# Patient Record
Sex: Female | Born: 1970
Health system: Southern US, Community
[De-identification: ages and names within clinical notes are randomized; demographics above are authoritative.]

## PROBLEM LIST (undated history)

## (undated) DIAGNOSIS — C801 Malignant (primary) neoplasm, unspecified: Secondary | ICD-10-CM

## (undated) DIAGNOSIS — D241 Benign neoplasm of right breast: Secondary | ICD-10-CM

## (undated) DIAGNOSIS — D509 Iron deficiency anemia, unspecified: Secondary | ICD-10-CM

## (undated) DIAGNOSIS — R112 Nausea with vomiting, unspecified: Secondary | ICD-10-CM

## (undated) DIAGNOSIS — Z9889 Other specified postprocedural states: Secondary | ICD-10-CM

## (undated) HISTORY — PX: BREAST SURGERY: SHX581

## (undated) HISTORY — PX: OTHER SURGICAL HISTORY: SHX169

## (undated) HISTORY — PX: HERNIA REPAIR: SHX51

## (undated) HISTORY — DX: Iron deficiency anemia, unspecified: D50.9

---

## 1998-01-19 ENCOUNTER — Other Ambulatory Visit: Admission: RE | Admit: 1998-01-19 | Discharge: 1998-01-19 | Payer: Self-pay | Admitting: Obstetrics and Gynecology

## 1998-02-21 ENCOUNTER — Inpatient Hospital Stay (HOSPITAL_COMMUNITY): Admission: AD | Admit: 1998-02-21 | Discharge: 1998-02-23 | Payer: Self-pay | Admitting: Obstetrics and Gynecology

## 1998-04-14 ENCOUNTER — Ambulatory Visit (HOSPITAL_COMMUNITY): Admission: RE | Admit: 1998-04-14 | Discharge: 1998-04-14 | Payer: Self-pay | Admitting: Obstetrics and Gynecology

## 2001-01-09 ENCOUNTER — Other Ambulatory Visit: Admission: RE | Admit: 2001-01-09 | Discharge: 2001-01-09 | Payer: Self-pay | Admitting: Obstetrics and Gynecology

## 2005-11-28 ENCOUNTER — Emergency Department (HOSPITAL_COMMUNITY): Admission: EM | Admit: 2005-11-28 | Discharge: 2005-11-28 | Payer: Self-pay | Admitting: Family Medicine

## 2005-11-29 ENCOUNTER — Ambulatory Visit (HOSPITAL_COMMUNITY): Admission: RE | Admit: 2005-11-29 | Discharge: 2005-11-29 | Payer: Self-pay | Admitting: Family Medicine

## 2005-12-13 ENCOUNTER — Encounter: Admission: RE | Admit: 2005-12-13 | Discharge: 2005-12-13 | Payer: Self-pay | Admitting: Surgery

## 2005-12-14 ENCOUNTER — Ambulatory Visit (HOSPITAL_BASED_OUTPATIENT_CLINIC_OR_DEPARTMENT_OTHER): Admission: RE | Admit: 2005-12-14 | Discharge: 2005-12-14 | Payer: Self-pay | Admitting: Surgery

## 2007-01-03 ENCOUNTER — Emergency Department (HOSPITAL_COMMUNITY): Admission: EM | Admit: 2007-01-03 | Discharge: 2007-01-03 | Payer: Self-pay | Admitting: Family Medicine

## 2007-08-25 ENCOUNTER — Emergency Department (HOSPITAL_COMMUNITY): Admission: EM | Admit: 2007-08-25 | Discharge: 2007-08-25 | Payer: Self-pay | Admitting: Emergency Medicine

## 2007-08-29 ENCOUNTER — Encounter: Admission: RE | Admit: 2007-08-29 | Discharge: 2007-08-29 | Payer: Self-pay | Admitting: Surgery

## 2008-06-01 ENCOUNTER — Encounter: Admission: RE | Admit: 2008-06-01 | Discharge: 2008-06-01 | Payer: Self-pay | Admitting: Geriatric Medicine

## 2010-09-01 NOTE — Op Note (Signed)
Sharon Meyer, Sharon Meyer          ACCOUNT NO.:  [id number]   MEDICAL RECORD NO.:  [id number]          PATIENT TYPE:  AMB   LOCATION:  [facility]                         FACILITY:  [facility]   PHYSICIAN:  Thomas A. Cornett, M.D.DATE OF BIRTH:  [birth year]   DATE OF PROCEDURE:  12/14/2005  DATE OF DISCHARGE:                                 OPERATIVE REPORT   PREOPERATIVE DIAGNOSIS:  Incarcerated umbilical hernia.   POSTOPERATIVE DIAGNOSIS:  Mass involving the fascia at umbilicus.   PROCEDURE:  Excision of abdominal wall mass measuring 4.4 cm found be  endometriosis by frozen section.   SURGEON:  Maisie Fus A. Cornett, M.D.   ANESTHESIA:  LMA with 410 mL of 0.25% Sensorcaine local.   ESTIMATED BLOOD LOSS:  20 mL   SPECIMEN:  4 x 4.5 cm mass to pathology found to be endometriosis by frozen  section by the pathologist.   DRAINS:  None.   INDICATIONS FOR PROCEDURE:  The patient is a 40 year old female who  presented to the office earlier this week due to a painful mass at her  umbilicus which came up over the last 3-4 weeks.  Upon examination, this was  felt to be an incarcerated umbilical hernia with preperitoneal fat and she  is brought in today for elective repair of this.  She had a previous tubal  ligation in the past.  Otherwise, she was doing well.  I explained the  procedure to her and she had no questions after that.   DESCRIPTION OF PROCEDURE:  The patient was brought to the operating room and  placed supine.  After induction of LMA anesthesia, the periumbilical region  was prepped and draped in a sterile fashion.  The nodule was located just  below the umbilicus and was hard.  I made an incision over this after  infiltrating with local anesthesia and dissected down.  I attempted to  mobilize the umbilicus but went to the umbilicus in the process.  A small  hole was made in the umbilicus.  Once I did this, I found the fascia and  felt no defect.  There was a mass located just below  the umbilicus and I  dissected circumferentially around this.  I made a small opening in the  fascia and placed my finger into the intra-abdominal cavity and there is no  evidence of any hernia.  This palpated to be a mass measuring roughly 4 cm  involving the midline of the fascia just below the umbilicus.  I used the  cautery to dissect this out and sent it for frozen section.  It was found to  be endometriosis.  I then closed the fascia with interrupted #1 PDS suture  taking large bites of fascia.  The defect closed without difficulty.  I then  used a 2-0 Vicryl to close the dead space created by dissecting around this  area.  The umbilicus was repaired using a 4-0 Monocryl stitch in a  subcuticular fashion.  I then used a 3-0 Vicryl to tack the umbilicus down  to the fascia.  I closed the skin incision  with 4-0 Monocryl.  A cotton  ball was placed in the umbilicus.  Steri-Strips  were applied.  All final counts of sponge, needle and instruments were found  to be correct at the end of the case.  The patient was awakened and taken to  the recovery room in satisfactory condition.      Thomas A. Cornett, M.D.  Electronically Signed     TAC/MEDQ  D:  12/14/2005  T:  12/14/2005  Job:  [id number]   cc:   Maurice March, M.D.  Fax: [phone]

## 2013-04-16 DIAGNOSIS — N6310 Unspecified lump in the right breast, unspecified quadrant: Secondary | ICD-10-CM | POA: Insufficient documentation

## 2014-06-22 ENCOUNTER — Encounter (HOSPITAL_COMMUNITY): Payer: Self-pay | Admitting: *Deleted

## 2014-06-22 ENCOUNTER — Other Ambulatory Visit: Payer: Self-pay | Admitting: Family Medicine

## 2014-06-22 ENCOUNTER — Emergency Department (HOSPITAL_COMMUNITY)
Admission: EM | Admit: 2014-06-22 | Discharge: 2014-06-22 | Disposition: A | Discharge status: Home / Self Care | Payer: BLUE CROSS/BLUE SHIELD | Source: Home / Self Care | Attending: Family Medicine | Admitting: Family Medicine

## 2014-06-22 DIAGNOSIS — D649 Anemia, unspecified: Secondary | ICD-10-CM

## 2014-06-22 DIAGNOSIS — N924 Excessive bleeding in the premenopausal period: Secondary | ICD-10-CM

## 2014-06-22 DIAGNOSIS — R634 Abnormal weight loss: Secondary | ICD-10-CM

## 2014-06-22 DIAGNOSIS — N921 Excessive and frequent menstruation with irregular cycle: Secondary | ICD-10-CM

## 2014-06-22 LAB — POCT URINALYSIS DIP (DEVICE)
Bilirubin Urine: NEGATIVE
GLUCOSE, UA: NEGATIVE mg/dL
HGB URINE DIPSTICK: NEGATIVE
KETONES UR: NEGATIVE mg/dL
Leukocytes, UA: NEGATIVE
Nitrite: NEGATIVE
PROTEIN: NEGATIVE mg/dL
SPECIFIC GRAVITY, URINE: 1.02 (ref 1.005–1.030)
UROBILINOGEN UA: 1 mg/dL (ref 0.0–1.0)
pH: 7 (ref 5.0–8.0)

## 2014-06-22 LAB — POCT I-STAT, CHEM 8
BUN: 4 mg/dL — ABNORMAL LOW (ref 6–23)
CALCIUM ION: 1.27 mmol/L — AB (ref 1.12–1.23)
CHLORIDE: 105 mmol/L (ref 96–112)
Creatinine, Ser: 0.5 mg/dL (ref 0.50–1.10)
GLUCOSE: 90 mg/dL (ref 70–99)
HEMATOCRIT: 26 % — AB (ref 36.0–46.0)
HEMOGLOBIN: 8.8 g/dL — AB (ref 12.0–15.0)
Potassium: 3.7 mmol/L (ref 3.5–5.1)
Sodium: 141 mmol/L (ref 135–145)
TCO2: 20 mmol/L (ref 0–100)

## 2014-06-22 NOTE — Discharge Instructions (Signed)
Go to dr dewey now for further eval and care.

## 2014-06-22 NOTE — ED Provider Notes (Signed)
CSN: [id number]     Arrival date & time 06/22/14  0174 History   First MD Initiated Contact with Patient 06/22/14 [phone]     Chief Complaint  Patient presents with  . Weight Loss   (Consider location/radiation/quality/duration/timing/severity/associated sxs/prior Treatment) Patient is a 44 y.o. female presenting with general illness. The history is provided by the patient.  Illness Location:  Gen unexplained wt loss since nov of 35-40 lbs with approx 10 in 1 week. Severity:  Moderate Progression:  Worsening Chronicity:  New Context:  Has had neg check for hiv and hepatitis, is menopausal , no smoking Associated symptoms: abdominal pain   Associated symptoms: no chest pain, no cough, no diarrhea, no fatigue, no fever, no nausea, no shortness of breath and no vomiting     History reviewed. No pertinent past medical history. Past Surgical History  Procedure Laterality Date  . Hernia repair    . Btl     History reviewed. No pertinent family history. History  Substance Use Topics  . Smoking status: Never Smoker   . Smokeless tobacco: Not on file  . Alcohol Use: No   OB History    No data available     Review of Systems  Constitutional: Negative.  Negative for fever and fatigue.  HENT: Negative.   Respiratory: Negative for cough and shortness of breath.   Cardiovascular: Negative for chest pain.  Gastrointestinal: Positive for abdominal pain. Negative for nausea, vomiting and diarrhea.  Genitourinary: Positive for pelvic pain.    Allergies  Review of patient's allergies indicates no known allergies.  Home Medications   Prior to Admission medications   Not on File   BP 104/54 mmHg  Pulse 90  Temp(Src) 99.3 F (37.4 C) (Oral)  Resp 16  SpO2 100%  LMP 06/08/2014 Physical Exam  Constitutional: She is oriented to person, place, and time. She appears well-developed and well-nourished. No distress.  HENT:  Head: Normocephalic.  Mouth/Throat: Oropharynx is clear and  moist.  Eyes: Conjunctivae are normal. Pupils are equal, round, and reactive to light.  Neck: Normal range of motion. Neck supple.  Cardiovascular: Normal rate, normal heart sounds and intact distal pulses.   Pulmonary/Chest: Effort normal and breath sounds normal.  Abdominal: Normal appearance and bowel sounds are normal. She exhibits mass. There is no hepatosplenomegaly. There is tenderness in the suprapubic area. There is no CVA tenderness.    Musculoskeletal: She exhibits no edema.  Lymphadenopathy:    She has no cervical adenopathy.  Neurological: She is alert and oriented to person, place, and time.  Skin: Skin is warm and dry.    ED Course  Procedures (including critical care time) Labs Review Labs Reviewed - No data to display i-statnl except hgb 8.8 hct 26, u/a neg Imaging Review No results found.   MDM   1. Unexplained weight loss        Billy Fischer, MD 06/22/14 1053

## 2014-06-22 NOTE — ED Notes (Signed)
Pt  Reports    She  Has  Lost     approx 35-40    Pounds         Over  approx  5  Months           She   Is  Awake  And  Alert   No  Pain  Except         For     Some  dizzyness          She denys  Any       Bleeding            She   States    She  Is  Not  Trying  To  Lose  Weight

## 2014-06-23 ENCOUNTER — Ambulatory Visit
Admission: RE | Admit: 2014-06-23 | Discharge: 2014-06-23 | Disposition: A | Discharge status: Home / Self Care | Payer: BLUE CROSS/BLUE SHIELD | Source: Ambulatory Visit | Attending: Family Medicine | Admitting: Family Medicine

## 2014-06-23 DIAGNOSIS — N924 Excessive bleeding in the premenopausal period: Secondary | ICD-10-CM

## 2014-06-23 DIAGNOSIS — D649 Anemia, unspecified: Secondary | ICD-10-CM

## 2014-06-23 DIAGNOSIS — N921 Excessive and frequent menstruation with irregular cycle: Secondary | ICD-10-CM

## 2014-06-25 ENCOUNTER — Telehealth: Payer: Self-pay | Admitting: Oncology

## 2014-06-25 ENCOUNTER — Other Ambulatory Visit: Payer: Self-pay | Admitting: Family Medicine

## 2014-06-25 DIAGNOSIS — N63 Unspecified lump in unspecified breast: Secondary | ICD-10-CM

## 2014-06-25 NOTE — Telephone Encounter (Signed)
Called referring office.  patient was in referring doctor's office for appt.  I gave Irma appt date and time and she was going to relay the information to patient.

## 2014-06-25 NOTE — Telephone Encounter (Signed)
Called patient to schedule new patient appt. left msg.  Called referring office to let them know that we attempted to call patient and lft msg. Also notified the office that we will hold appt until Tuesday, but it is our policy to confirm appointment before placing in system.    Dx: Anemia, weight loss

## 2014-06-30 ENCOUNTER — Other Ambulatory Visit: Payer: Self-pay | Admitting: Family Medicine

## 2014-06-30 ENCOUNTER — Ambulatory Visit
Admission: RE | Admit: 2014-06-30 | Discharge: 2014-06-30 | Disposition: A | Discharge status: Home / Self Care | Payer: BLUE CROSS/BLUE SHIELD | Source: Ambulatory Visit | Attending: Family Medicine | Admitting: Family Medicine

## 2014-06-30 ENCOUNTER — Other Ambulatory Visit: Payer: BLUE CROSS/BLUE SHIELD

## 2014-06-30 DIAGNOSIS — N63 Unspecified lump in unspecified breast: Secondary | ICD-10-CM

## 2014-07-05 ENCOUNTER — Other Ambulatory Visit: Payer: Self-pay | Admitting: Family Medicine

## 2014-07-05 DIAGNOSIS — N63 Unspecified lump in unspecified breast: Secondary | ICD-10-CM

## 2014-07-09 ENCOUNTER — Ambulatory Visit
Admission: RE | Admit: 2014-07-09 | Discharge: 2014-07-09 | Disposition: A | Discharge status: Home / Self Care | Payer: BLUE CROSS/BLUE SHIELD | Source: Ambulatory Visit | Attending: Family Medicine | Admitting: Family Medicine

## 2014-07-09 DIAGNOSIS — N63 Unspecified lump in unspecified breast: Secondary | ICD-10-CM

## 2014-07-19 ENCOUNTER — Other Ambulatory Visit: Payer: Self-pay | Admitting: Oncology

## 2014-07-19 DIAGNOSIS — D500 Iron deficiency anemia secondary to blood loss (chronic): Secondary | ICD-10-CM

## 2014-07-20 ENCOUNTER — Encounter: Payer: Self-pay | Admitting: Oncology

## 2014-07-20 ENCOUNTER — Telehealth: Payer: Self-pay | Admitting: Oncology

## 2014-07-20 ENCOUNTER — Other Ambulatory Visit (HOSPITAL_BASED_OUTPATIENT_CLINIC_OR_DEPARTMENT_OTHER): Payer: BLUE CROSS/BLUE SHIELD

## 2014-07-20 ENCOUNTER — Ambulatory Visit (HOSPITAL_BASED_OUTPATIENT_CLINIC_OR_DEPARTMENT_OTHER): Payer: BLUE CROSS/BLUE SHIELD | Admitting: Oncology

## 2014-07-20 ENCOUNTER — Ambulatory Visit: Payer: BLUE CROSS/BLUE SHIELD

## 2014-07-20 VITALS — BP 105/69 | HR 61 | Temp 98.2°F | Resp 18 | Ht 68.0 in | Wt 150.1 lb

## 2014-07-20 DIAGNOSIS — D500 Iron deficiency anemia secondary to blood loss (chronic): Secondary | ICD-10-CM

## 2014-07-20 DIAGNOSIS — D259 Leiomyoma of uterus, unspecified: Secondary | ICD-10-CM | POA: Diagnosis not present

## 2014-07-20 DIAGNOSIS — N920 Excessive and frequent menstruation with regular cycle: Secondary | ICD-10-CM | POA: Diagnosis not present

## 2014-07-20 DIAGNOSIS — R928 Other abnormal and inconclusive findings on diagnostic imaging of breast: Secondary | ICD-10-CM | POA: Diagnosis not present

## 2014-07-20 LAB — FERRITIN CHCC: FERRITIN: 22 ng/mL (ref 9–269)

## 2014-07-20 LAB — CBC WITH DIFFERENTIAL/PLATELET
BASO%: 1.6 % (ref 0.0–2.0)
Basophils Absolute: 0.1 10*3/uL (ref 0.0–0.1)
EOS%: 1.9 % (ref 0.0–7.0)
Eosinophils Absolute: 0.1 10*3/uL (ref 0.0–0.5)
HEMATOCRIT: 30.2 % — AB (ref 34.8–46.6)
HEMOGLOBIN: 8.9 g/dL — AB (ref 11.6–15.9)
LYMPH#: 1.7 10*3/uL (ref 0.9–3.3)
LYMPH%: 32.5 % (ref 14.0–49.7)
MCH: 20.8 pg — ABNORMAL LOW (ref 25.1–34.0)
MCHC: 29.4 g/dL — ABNORMAL LOW (ref 31.5–36.0)
MCV: 70.7 fL — AB (ref 79.5–101.0)
MONO#: 0.4 10*3/uL (ref 0.1–0.9)
MONO%: 6.8 % (ref 0.0–14.0)
NEUT#: 3 10*3/uL (ref 1.5–6.5)
NEUT%: 57.2 % (ref 38.4–76.8)
Platelets: 626 10*3/uL — ABNORMAL HIGH (ref 145–400)
RBC: 4.27 10*6/uL (ref 3.70–5.45)
RDW: 35.8 % — ABNORMAL HIGH (ref 11.2–14.5)
WBC: 5.2 10*3/uL (ref 3.9–10.3)

## 2014-07-20 LAB — COMPREHENSIVE METABOLIC PANEL (CC13)
ALBUMIN: 3.6 g/dL (ref 3.5–5.0)
ALT: 9 U/L (ref 0–55)
ANION GAP: 9 meq/L (ref 3–11)
AST: 11 U/L (ref 5–34)
Alkaline Phosphatase: 58 U/L (ref 40–150)
BUN: 7.3 mg/dL (ref 7.0–26.0)
CALCIUM: 9.1 mg/dL (ref 8.4–10.4)
CHLORIDE: 108 meq/L (ref 98–109)
CO2: 23 meq/L (ref 22–29)
Creatinine: 0.7 mg/dL (ref 0.6–1.1)
GLUCOSE: 77 mg/dL (ref 70–140)
POTASSIUM: 3.7 meq/L (ref 3.5–5.1)
SODIUM: 140 meq/L (ref 136–145)
TOTAL PROTEIN: 7.7 g/dL (ref 6.4–8.3)
Total Bilirubin: 0.26 mg/dL (ref 0.20–1.20)

## 2014-07-20 LAB — IRON AND TIBC CHCC
%SAT: 10 % — ABNORMAL LOW (ref 21–57)
IRON: 38 ug/dL — AB (ref 41–142)
TIBC: 373 ug/dL (ref 236–444)
UIBC: 334 ug/dL (ref 120–384)

## 2014-07-20 NOTE — Consult Note (Signed)
Reason for Referral: Anemia.   HPI: 44 year old woman currently of [place] where she have been living and working last few years. She is a rather healthy woman with no significant comorbid conditions. She was noted to be profoundly anemic after presenting with symptoms of fatigue, tiredness and menorrhagia. She had a hemoglobin done on March date 2016 and at that time her hemoglobin was 6.1. Her white cell count was normal at 5.6, MCV was 61 and platelets of 441. Her ferritin was low at 3. She was started on iron supplement and have been taking 3 tablets a day of iron sulfate. She tolerated these pills well without any problem with nausea, dyspepsia or constipation. Her fatigue which has been profound has improved and her ice cravings have improved dramatically. She did not report any hematochezia or melena. She did not report any hematuria or epistaxis. She had been told that she has uterine fibroids and have had heavy menses in the last year. She reports she has 7 day menstrual cycles and from date 2 today for a rather heavy.  She does not report any headaches, blurry vision, double vision, syncope or seizures. She does not report any fevers, chills, sweats she did report weight loss but have stabilized now. She did not report any chest pain, palpitation she did report some fatigue and dyspnea. She did not report any leg edema or palpitation. She does not report any cough or wheezing. She does not report any nausea, vomiting, abdominal pain. She did not report any early satiety. She does not report any frequency urgency or hesitancy. Remainder review of systems unremarkable.   No past medical history on file.:  Past Surgical History  Procedure Laterality Date  . Hernia repair    . Btl    :   Current outpatient prescriptions:  .  ferrous sulfate 325 (65 FE) MG tablet, Take 325 mg by mouth 3 (three) times daily., Disp: , Rfl: 2 .  Vitamin D, Ergocalciferol, (DRISDOL) 50000 UNITS CAPS capsule,  Take 50,000 Units by mouth once a week., Disp: , Rfl: 1:  No Known Allergies:  No family history on file.:  History   Social History  . Marital Status: Single    Spouse Name: N/A  . Number of Children: N/A  . Years of Education: N/A   Occupational History  . Not on file.   Social History Main Topics  . Smoking status: Never Smoker   . Smokeless tobacco: Not on file  . Alcohol Use: No  . Drug Use: Not on file  . Sexual Activity: Not on file   Other Topics Concern  . Not on file   Social History Narrative  . No narrative on file  :  Pertinent items are noted in HPI.  Exam: Blood pressure 105/69, pulse 61, temperature 98.2 F (36.8 C), temperature source Oral, resp. rate 18, height 5\' 8"  (1.727 m), weight 150 lb 1.6 oz (68.085 kg), last menstrual period 06/08/2014, SpO2 100 %. General appearance: alert and cooperative Head: Normocephalic, without obvious abnormality Throat: lips, mucosa, and tongue normal; teeth and gums normal Neck: no adenopathy Back: negative Resp: clear to auscultation bilaterally Chest wall: no tenderness Cardio: regular rate and rhythm, S1, S2 normal, no murmur, click, rub or gallop GI: soft, non-tender; bowel sounds normal; no masses,  no organomegaly Extremities: extremities normal, atraumatic, no cyanosis or edema Pulses: 2+ and symmetric Skin: Skin color, texture, turgor normal. No rashes or lesions Lymph nodes: Cervical, supraclavicular, and axillary nodes normal.  Recent Labs  07/20/14 1352  WBC 5.2  HGB 8.9*  HCT 30.2*  PLT 626*     US Transvaginal Non-ob  06/23/2014   CLINICAL DATA:  EXCESSIVE BLEEDING/ANEMIA; ANEMIA,METRORRHAGIA/ABNORMAL MENSES  EXAM: TRANSABDOMINAL AND TRANSVAGINAL ULTRASOUND OF PELVIS  TECHNIQUE: Both transabdominal and transvaginal ultrasound examinations of the pelvis were performed. Transabdominal technique was performed for global imaging of the pelvis including uterus, ovaries, adnexal regions, and  pelvic cul-de-sac. It was necessary to proceed with endovaginal exam following the transabdominal exam to visualize the ovaries and endometrial stripe.  COMPARISON:  None.  FINDINGS: Uterus  Measurements: 11.9 cm x 7.8 cm x 8.7 cm. RIGHT fundal fibroid is present. This is myometrial in location and extends to the sub serosal region. Fibroid measures 38 mm x 28 mm x 28 mm.  Endometrium  Thickness: Normal at 6 mm.  No focal abnormality visualized.  Right ovary  Measurements: 38 mm x 17 mm x 24 mm. Normal appearance/no adnexal mass.  Left ovary  Measurements: 15 mm x 25 mm x 26 mm. 21 mm cyst or follicle. Normal appearance/no adnexal mass.  Other findings  No free fluid.  IMPRESSION: 1. Fibroid uterus. 2. Normal endometrial stripe. Normal physiologic appearance of the ovaries.   Electronically Signed   By: Dereck Ligas M.D.   On: 06/23/2014 15:40   US Pelvis Complete  06/23/2014   CLINICAL DATA:  EXCESSIVE BLEEDING/ANEMIA; ANEMIA,METRORRHAGIA/ABNORMAL MENSES  EXAM: TRANSABDOMINAL AND TRANSVAGINAL ULTRASOUND OF PELVIS  TECHNIQUE: Both transabdominal and transvaginal ultrasound examinations of the pelvis were performed. Transabdominal technique was performed for global imaging of the pelvis including uterus, ovaries, adnexal regions, and pelvic cul-de-sac. It was necessary to proceed with endovaginal exam following the transabdominal exam to visualize the ovaries and endometrial stripe.  COMPARISON:  None.  FINDINGS: Uterus  Measurements: 11.9 cm x 7.8 cm x 8.7 cm. RIGHT fundal fibroid is present. This is myometrial in location and extends to the sub serosal region. Fibroid measures 38 mm x 28 mm x 28 mm.  Endometrium  Thickness: Normal at 6 mm.  No focal abnormality visualized.  Right ovary  Measurements: 38 mm x 17 mm x 24 mm. Normal appearance/no adnexal mass.  Left ovary  Measurements: 15 mm x 25 mm x 26 mm. 21 mm cyst or follicle. Normal appearance/no adnexal mass.  Other findings  No free fluid.  IMPRESSION:  1. Fibroid uterus. 2. Normal endometrial stripe. Normal physiologic appearance of the ovaries.   Electronically Signed   By: Dereck Ligas M.D.   On: 06/23/2014 15:40      Assessment and Plan:    44 year old woman with the following issues:  1. Microcytic anemia related to iron deficiency. Her hemoglobin was as low as 6.1 3 weeks ago now is improved to 8.9. She is symptomatically much improved at this time after taking oral iron for the last 3 weeks. She has not reported any dyspepsia or any other GI symptoms associated with it. I do not think there is other hematological disorder associated with her iron deficiency anemia. I think her microcytosis is predominantly related to iron deficiency as its correcting properly with oral iron. She is responding very well to oral iron do not think she needs IV iron at this time. I gave her the option of using Feraheme intravenously and she has elected to continue with oral iron. I plan on repeating her iron studies in about 3 months to document normalization.  2. Fibroid tumors: This is causing menorrhagia and exacerbating  her iron deficiency. I have recommended referral to OB/GYN for opinion regarding her menorrhagia.  3. 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 Excell Seltzer from [facility] for an evaluation on 07/28/2014.

## 2014-07-20 NOTE — Progress Notes (Signed)
Checked in new pt with no financial concerns at this time.  Pt has my card for any billing questions or concerns. °

## 2014-07-20 NOTE — Telephone Encounter (Signed)
Gave avs & calendar for July °

## 2014-07-20 NOTE — Progress Notes (Signed)
Please see consult note.  

## 2014-07-22 LAB — SPEP & IFE WITH QIG
Albumin ELP: 3.7 g/dL — ABNORMAL LOW (ref 3.8–4.8)
Alpha-1-Globulin: 0.3 g/dL (ref 0.2–0.3)
Alpha-2-Globulin: 0.7 g/dL (ref 0.5–0.9)
BETA 2: 0.4 g/dL (ref 0.2–0.5)
BETA GLOBULIN: 0.5 g/dL (ref 0.4–0.6)
Gamma Globulin: 2 g/dL — ABNORMAL HIGH (ref 0.8–1.7)
IGA: 131 mg/dL (ref 69–380)
IgG (Immunoglobin G), Serum: 1970 mg/dL — ABNORMAL HIGH (ref 690–1700)
IgM, Serum: 170 mg/dL (ref 52–322)
Total Protein, Serum Electrophoresis: 7.5 g/dL (ref 6.1–8.1)

## 2014-07-28 ENCOUNTER — Other Ambulatory Visit: Payer: Self-pay | Admitting: General Surgery

## 2014-07-28 DIAGNOSIS — D241 Benign neoplasm of right breast: Secondary | ICD-10-CM

## 2014-07-30 ENCOUNTER — Other Ambulatory Visit: Payer: Self-pay | Admitting: General Surgery

## 2014-07-30 DIAGNOSIS — D241 Benign neoplasm of right breast: Secondary | ICD-10-CM

## 2014-08-03 ENCOUNTER — Other Ambulatory Visit: Payer: Self-pay | Admitting: Family Medicine

## 2014-08-03 DIAGNOSIS — N924 Excessive bleeding in the premenopausal period: Secondary | ICD-10-CM

## 2014-08-03 DIAGNOSIS — D259 Leiomyoma of uterus, unspecified: Secondary | ICD-10-CM

## 2014-08-04 ENCOUNTER — Ambulatory Visit
Admission: RE | Admit: 2014-08-04 | Discharge: 2014-08-04 | Disposition: A | Discharge status: Home / Self Care | Payer: BLUE CROSS/BLUE SHIELD | Source: Ambulatory Visit | Attending: General Surgery | Admitting: General Surgery

## 2014-08-04 DIAGNOSIS — D241 Benign neoplasm of right breast: Secondary | ICD-10-CM

## 2014-08-05 ENCOUNTER — Encounter (HOSPITAL_BASED_OUTPATIENT_CLINIC_OR_DEPARTMENT_OTHER): Payer: Self-pay | Admitting: *Deleted

## 2014-08-05 NOTE — Progress Notes (Signed)
Spoke with Dr. Tobias Alexander about pt's HGB 8.9 -ok for surgery will check Hgb day of surgery

## 2014-08-06 ENCOUNTER — Ambulatory Visit
Admission: RE | Admit: 2014-08-06 | Discharge: 2014-08-06 | Disposition: A | Discharge status: Home / Self Care | Payer: BLUE CROSS/BLUE SHIELD | Source: Ambulatory Visit | Attending: Family Medicine | Admitting: Family Medicine

## 2014-08-06 DIAGNOSIS — N924 Excessive bleeding in the premenopausal period: Secondary | ICD-10-CM

## 2014-08-06 DIAGNOSIS — D259 Leiomyoma of uterus, unspecified: Secondary | ICD-10-CM

## 2014-08-09 ENCOUNTER — Encounter (HOSPITAL_BASED_OUTPATIENT_CLINIC_OR_DEPARTMENT_OTHER)
Admission: RE | Disposition: A | Discharge status: Home / Self Care | Payer: Self-pay | Source: Ambulatory Visit | Attending: General Surgery

## 2014-08-09 ENCOUNTER — Encounter (HOSPITAL_BASED_OUTPATIENT_CLINIC_OR_DEPARTMENT_OTHER): Payer: Self-pay

## 2014-08-09 ENCOUNTER — Ambulatory Visit
Admission: RE | Admit: 2014-08-09 | Discharge: 2014-08-09 | Disposition: A | Discharge status: Home / Self Care | Payer: BLUE CROSS/BLUE SHIELD | Source: Ambulatory Visit | Attending: General Surgery | Admitting: General Surgery

## 2014-08-09 ENCOUNTER — Ambulatory Visit (HOSPITAL_BASED_OUTPATIENT_CLINIC_OR_DEPARTMENT_OTHER): Payer: BLUE CROSS/BLUE SHIELD | Admitting: Anesthesiology

## 2014-08-09 ENCOUNTER — Ambulatory Visit (HOSPITAL_BASED_OUTPATIENT_CLINIC_OR_DEPARTMENT_OTHER)
Admission: RE | Admit: 2014-08-09 | Discharge: 2014-08-09 | Disposition: A | Discharge status: Home / Self Care | Payer: BLUE CROSS/BLUE SHIELD | Source: Ambulatory Visit | Attending: General Surgery | Admitting: General Surgery

## 2014-08-09 DIAGNOSIS — N6091 Unspecified benign mammary dysplasia of right breast: Secondary | ICD-10-CM | POA: Diagnosis not present

## 2014-08-09 DIAGNOSIS — N6452 Nipple discharge: Secondary | ICD-10-CM | POA: Diagnosis present

## 2014-08-09 DIAGNOSIS — N6011 Diffuse cystic mastopathy of right breast: Secondary | ICD-10-CM | POA: Insufficient documentation

## 2014-08-09 DIAGNOSIS — D241 Benign neoplasm of right breast: Secondary | ICD-10-CM | POA: Insufficient documentation

## 2014-08-09 DIAGNOSIS — Z79899 Other long term (current) drug therapy: Secondary | ICD-10-CM | POA: Diagnosis not present

## 2014-08-09 HISTORY — PX: BREAST LUMPECTOMY WITH RADIOACTIVE SEED LOCALIZATION: SHX6424

## 2014-08-09 HISTORY — DX: Malignant (primary) neoplasm, unspecified: C80.1

## 2014-08-09 HISTORY — PX: BREAST EXCISIONAL BIOPSY: SUR124

## 2014-08-09 LAB — POCT HEMOGLOBIN-HEMACUE: HEMOGLOBIN: 10.1 g/dL — AB (ref 12.0–15.0)

## 2014-08-09 SURGERY — BREAST LUMPECTOMY WITH RADIOACTIVE SEED LOCALIZATION
Anesthesia: General | Site: Breast | Laterality: Right

## 2014-08-09 MED ORDER — MEPERIDINE HCL 25 MG/ML IJ SOLN: 6.2500 mg | INTRAMUSCULAR | Status: DC | PRN

## 2014-08-09 MED ORDER — IBUPROFEN 100 MG/5ML PO SUSP: 200.0000 mg | Freq: Four times a day (QID) | ORAL | Status: DC | PRN

## 2014-08-09 MED ORDER — HYDROMORPHONE HCL 1 MG/ML IJ SOLN: 0.2500 mg | INTRAMUSCULAR | Status: DC | PRN

## 2014-08-09 MED ORDER — FENTANYL CITRATE (PF) 100 MCG/2ML IJ SOLN
INTRAMUSCULAR | Status: DC | PRN
  Administered 2014-08-09: 100 ug via INTRAVENOUS

## 2014-08-09 MED ORDER — SCOPOLAMINE 1 MG/3DAYS TD PT72
MEDICATED_PATCH | TRANSDERMAL | Status: AC
  Filled 2014-08-09: qty 1

## 2014-08-09 MED ORDER — IBUPROFEN 200 MG PO TABS: 200.0000 mg | ORAL_TABLET | Freq: Four times a day (QID) | ORAL | Status: DC | PRN

## 2014-08-09 MED ORDER — MIDAZOLAM HCL 2 MG/2ML IJ SOLN
INTRAMUSCULAR | Status: AC
  Filled 2014-08-09: qty 2

## 2014-08-09 MED ORDER — CHLORHEXIDINE GLUCONATE 4 % EX LIQD: 1.0000 "application " | Freq: Once | CUTANEOUS | Status: DC

## 2014-08-09 MED ORDER — MIDAZOLAM HCL 2 MG/2ML IJ SOLN
1.0000 mg | INTRAMUSCULAR | Status: DC | PRN
  Administered 2014-08-09: 2 mg via INTRAVENOUS

## 2014-08-09 MED ORDER — FENTANYL CITRATE (PF) 100 MCG/2ML IJ SOLN
50.0000 ug | INTRAMUSCULAR | Status: DC | PRN
Start: 2014-08-09 — End: 2014-08-09

## 2014-08-09 MED ORDER — SCOPOLAMINE 1 MG/3DAYS TD PT72
1.0000 | MEDICATED_PATCH | TRANSDERMAL | Status: DC
  Administered 2014-08-09: 1.5 mg via TRANSDERMAL

## 2014-08-09 MED ORDER — BUPIVACAINE-EPINEPHRINE (PF) 0.25% -1:200000 IJ SOLN
INTRAMUSCULAR | Status: AC
  Filled 2014-08-09: qty 30

## 2014-08-09 MED ORDER — FENTANYL CITRATE (PF) 100 MCG/2ML IJ SOLN
INTRAMUSCULAR | Status: AC
  Filled 2014-08-09: qty 6

## 2014-08-09 MED ORDER — BUPIVACAINE-EPINEPHRINE (PF) 0.25% -1:200000 IJ SOLN
INTRAMUSCULAR | Status: DC | PRN
Start: 2014-08-09 — End: 2014-08-09
  Administered 2014-08-09: 10 mL via PERINEURAL

## 2014-08-09 MED ORDER — LIDOCAINE HCL (CARDIAC) 20 MG/ML IV SOLN
INTRAVENOUS | Status: DC | PRN
  Administered 2014-08-09: 60 mg via INTRAVENOUS

## 2014-08-09 MED ORDER — CEFAZOLIN SODIUM-DEXTROSE 2-3 GM-% IV SOLR
2.0000 g | INTRAVENOUS | Status: DC
Start: 2014-08-10 — End: 2014-08-09

## 2014-08-09 MED ORDER — OXYCODONE HCL 5 MG/5ML PO SOLN: 5.0000 mg | Freq: Once | ORAL | Status: DC | PRN

## 2014-08-09 MED ORDER — HYDROCODONE-ACETAMINOPHEN 5-325 MG PO TABS: 1.0000 | ORAL_TABLET | ORAL | Status: DC | PRN

## 2014-08-09 MED ORDER — PROPOFOL 500 MG/50ML IV EMUL
INTRAVENOUS | Status: AC
  Filled 2014-08-09: qty 50

## 2014-08-09 MED ORDER — CEFAZOLIN SODIUM-DEXTROSE 2-3 GM-% IV SOLR
INTRAVENOUS | Status: AC
  Filled 2014-08-09: qty 50

## 2014-08-09 MED ORDER — KETOROLAC TROMETHAMINE 30 MG/ML IJ SOLN: 30.0000 mg | Freq: Once | INTRAMUSCULAR | Status: DC | PRN

## 2014-08-09 MED ORDER — OXYCODONE HCL 5 MG PO TABS: 5.0000 mg | ORAL_TABLET | Freq: Once | ORAL | Status: DC | PRN

## 2014-08-09 MED ORDER — PROPOFOL 10 MG/ML IV BOLUS
INTRAVENOUS | Status: DC | PRN
  Administered 2014-08-09: 150 mg via INTRAVENOUS

## 2014-08-09 MED ORDER — LACTATED RINGERS IV SOLN
INTRAVENOUS | Status: DC
  Administered 2014-08-09 (×2): via INTRAVENOUS

## 2014-08-09 SURGICAL SUPPLY — 48 items
APPLIER CLIP 9.375 MED OPEN (MISCELLANEOUS)
BINDER BREAST LRG (GAUZE/BANDAGES/DRESSINGS) IMPLANT
BINDER BREAST MEDIUM (GAUZE/BANDAGES/DRESSINGS) IMPLANT
BINDER BREAST XLRG (GAUZE/BANDAGES/DRESSINGS) IMPLANT
BINDER BREAST XXLRG (GAUZE/BANDAGES/DRESSINGS) IMPLANT
BLADE SURG 15 STRL LF DISP TIS (BLADE) ×1 IMPLANT
BLADE SURG 15 STRL SS (BLADE) ×2
CANISTER SUC SOCK COL 7IN (MISCELLANEOUS) IMPLANT
CANISTER SUCT 1200ML W/VALVE (MISCELLANEOUS) IMPLANT
CHLORAPREP W/TINT 26ML (MISCELLANEOUS) ×3 IMPLANT
CLIP APPLIE 9.375 MED OPEN (MISCELLANEOUS) IMPLANT
COVER BACK TABLE 60X90IN (DRAPES) ×3 IMPLANT
COVER MAYO STAND STRL (DRAPES) ×3 IMPLANT
COVER PROBE W GEL 5X96 (DRAPES) ×3 IMPLANT
DECANTER SPIKE VIAL GLASS SM (MISCELLANEOUS) IMPLANT
DEVICE DUBIN W/COMP PLATE 8390 (MISCELLANEOUS) ×3 IMPLANT
DRAPE LAPAROSCOPIC ABDOMINAL (DRAPES) ×3 IMPLANT
DRAPE UTILITY XL STRL (DRAPES) ×3 IMPLANT
ELECT COATED BLADE 2.86 ST (ELECTRODE) ×3 IMPLANT
ELECT REM PT RETURN 9FT ADLT (ELECTROSURGICAL) ×3
ELECTRODE REM PT RTRN 9FT ADLT (ELECTROSURGICAL) ×1 IMPLANT
GLOVE BIOGEL PI IND STRL 7.0 (GLOVE) ×2 IMPLANT
GLOVE BIOGEL PI IND STRL 8 (GLOVE) ×1 IMPLANT
GLOVE BIOGEL PI INDICATOR 7.0 (GLOVE) ×4
GLOVE BIOGEL PI INDICATOR 8 (GLOVE) ×2
GLOVE ECLIPSE 6.5 STRL STRAW (GLOVE) ×3 IMPLANT
GLOVE ECLIPSE 7.5 STRL STRAW (GLOVE) ×6 IMPLANT
GOWN STRL REUS W/ TWL LRG LVL3 (GOWN DISPOSABLE) ×1 IMPLANT
GOWN STRL REUS W/ TWL XL LVL3 (GOWN DISPOSABLE) ×1 IMPLANT
GOWN STRL REUS W/TWL LRG LVL3 (GOWN DISPOSABLE) ×2
GOWN STRL REUS W/TWL XL LVL3 (GOWN DISPOSABLE) ×2
KIT MARKER MARGIN INK (KITS) IMPLANT
LIQUID BAND (GAUZE/BANDAGES/DRESSINGS) ×3 IMPLANT
NEEDLE HYPO 25X1 1.5 SAFETY (NEEDLE) ×3 IMPLANT
NS IRRIG 1000ML POUR BTL (IV SOLUTION) IMPLANT
PACK BASIN DAY SURGERY FS (CUSTOM PROCEDURE TRAY) ×3 IMPLANT
PENCIL BUTTON HOLSTER BLD 10FT (ELECTRODE) ×3 IMPLANT
SLEEVE SCD COMPRESS KNEE MED (MISCELLANEOUS) ×3 IMPLANT
SPONGE LAP 4X18 X RAY DECT (DISPOSABLE) ×3 IMPLANT
SUT MNCRL AB 4-0 PS2 18 (SUTURE) ×3 IMPLANT
SUT SILK 2 0 SH (SUTURE) ×3 IMPLANT
SUT VICRYL 3-0 CR8 SH (SUTURE) ×3 IMPLANT
SYR CONTROL 10ML LL (SYRINGE) ×3 IMPLANT
TOWEL OR 17X24 6PK STRL BLUE (TOWEL DISPOSABLE) ×3 IMPLANT
TOWEL OR NON WOVEN STRL DISP B (DISPOSABLE) IMPLANT
TUBE CONNECTING 20'X1/4 (TUBING) ×1
TUBE CONNECTING 20X1/4 (TUBING) ×2 IMPLANT
YANKAUER SUCT BULB TIP NO VENT (SUCTIONS) ×3 IMPLANT

## 2014-08-09 NOTE — Discharge Instructions (Signed)
[facility] °Office Phone Number [phone] ° °BREAST BIOPSY/ PARTIAL MASTECTOMY: POST OP INSTRUCTIONS ° °Always review your discharge instruction sheet given to you by the facility where your surgery was performed. ° °IF YOU HAVE DISABILITY OR FAMILY LEAVE FORMS, YOU MUST BRING THEM TO THE OFFICE FOR PROCESSING.  DO NOT GIVE THEM TO YOUR DOCTOR. ° °1. A prescription for pain medication may be given to you upon discharge.  Take your pain medication as prescribed, if needed.  If narcotic pain medicine is not needed, then you may take acetaminophen (Tylenol) or ibuprofen (Advil) as needed. °2. Take your usually prescribed medications unless otherwise directed °3. If you need a refill on your pain medication, please contact your pharmacy.  They will contact our office to request authorization.  Prescriptions will not be filled after 5pm or on week-ends. °4. You should eat very light the first 24 hours after surgery, such as soup, crackers, pudding, etc.  Resume your normal diet the day after surgery. °5. Most patients will experience some swelling and bruising in the breast.  Ice packs and a good support bra will help.  Swelling and bruising can take several days to resolve.  °6. It is common to experience some constipation if taking pain medication after surgery.  Increasing fluid intake and taking a stool softener will usually help or prevent this problem from occurring.  A mild laxative (Milk of Magnesia or Miralax) should be taken according to package directions if there are no bowel movements after 48 hours. °7. Unless discharge instructions indicate otherwise, you may remove your bandages 24-48 hours after surgery, and you may shower at that time.  You may have steri-strips (small skin tapes) in place directly over the incision.  These strips should be left on the skin for 7-10 days.  If your surgeon used skin glue on the incision, you may shower in 24 hours.  The glue will flake off over the  next 2-3 weeks.  Any sutures or staples will be removed at the office during your follow-up visit. °8. ACTIVITIES:  You may resume regular daily activities (gradually increasing) beginning the next day.  Wearing a good support bra or sports bra minimizes pain and swelling.  You may have sexual intercourse when it is comfortable. °a. You may drive when you no longer are taking prescription pain medication, you can comfortably wear a seatbelt, and you can safely maneuver your car and apply brakes. °b. RETURN TO WORK:  ______________________________________________________________________________________ °9. You should see your doctor in the office for a follow-up appointment approximately two weeks after your surgery.  Your doctor’s nurse will typically make your follow-up appointment when she calls you with your pathology report.  Expect your pathology report 2-3 business days after your surgery.  You may call to check if you do not hear from us after three days. °10. OTHER INSTRUCTIONS: _______________________________________________________________________________________________ _____________________________________________________________________________________________________________________________________ °_____________________________________________________________________________________________________________________________________ °_____________________________________________________________________________________________________________________________________ ° °WHEN TO CALL YOUR DOCTOR: °1. Fever over 101.0 °2. Nausea and/or vomiting. °3. Extreme swelling or bruising. °4. Continued bleeding from incision. °5. Increased pain, redness, or drainage from the incision. ° °The clinic staff is available to answer your questions during regular business hours.  Please don’t hesitate to call and ask to speak to one of the nurses for clinical concerns.  If you have a medical emergency, go to the nearest  emergency room or call 911.  A surgeon from [facility] is always on call at the hospital. ° °For further questions, please visit [email or web address]  ° ° ° °  Post Anesthesia Home Care Instructions ° °Activity: °Get plenty of rest for the remainder of the day. A responsible adult should stay with you for 24 hours following the procedure.  °For the next 24 hours, DO NOT: °-Drive a car °-Operate machinery °-Drink alcoholic beverages °-Take any medication unless instructed by your physician °-Make any legal decisions or sign important papers. ° °Meals: °Start with liquid foods such as gelatin or soup. Progress to regular foods as tolerated. Avoid greasy, spicy, heavy foods. If nausea and/or vomiting occur, drink only clear liquids until the nausea and/or vomiting subsides. Call your physician if vomiting continues. ° °Special Instructions/Symptoms: °Your throat may feel dry or sore from the anesthesia or the breathing tube placed in your throat during surgery. If this causes discomfort, gargle with warm salt water. The discomfort should disappear within 24 hours. ° °If you had a scopolamine patch placed behind your ear for the management of post- operative nausea and/or vomiting: ° °1. The medication in the patch is effective for 72 hours, after which it should be removed.  Wrap patch in a tissue and discard in the trash. Wash hands thoroughly with soap and water. °2. You may remove the patch earlier than 72 hours if you experience unpleasant side effects which may include dry mouth, dizziness or visual disturbances. °3. Avoid touching the patch. Wash your hands with soap and water after contact with the patch. °  ° °

## 2014-08-09 NOTE — Transfer of Care (Signed)
Immediate Anesthesia Transfer of Care Note  Patient: Sharon Meyer  Procedure(s) Performed: Procedure(s): RIGHT BREAST LUMPECTOMY WITH RADIOACTIVE SEED LOCALIZATION (Right)  Patient Location: PACU  Anesthesia Type:General  Level of Consciousness: awake, sedated and patient cooperative  Airway & Oxygen Therapy: Patient Spontanous Breathing  Post-op Assessment: Report given to RN and Post -op Vital signs reviewed and stable  Post vital signs: Reviewed and stable  Last Vitals:  Filed Vitals:   08/09/14 1356  BP: 108/71  Pulse: 71  Temp: 36.8 C  Resp: 18    Complications: No apparent anesthesia complications

## 2014-08-09 NOTE — Anesthesia Procedure Notes (Signed)
Procedure Name: LMA Insertion Date/Time: 08/09/2014 3:35 PM Performed by: Lyndee Leo Pre-anesthesia Checklist: Patient identified, Emergency Drugs available, Suction available and Patient being monitored Patient Re-evaluated:Patient Re-evaluated prior to inductionOxygen Delivery Method: Circle System Utilized Preoxygenation: Pre-oxygenation with 100% oxygen Intubation Type: IV induction Ventilation: Mask ventilation without difficulty LMA: LMA inserted LMA Size: 4.0 Number of attempts: 1 Airway Equipment and Method: Bite block Placement Confirmation: positive ETCO2 Tube secured with: Tape Dental Injury: Teeth and Oropharynx as per pre-operative assessment

## 2014-08-09 NOTE — Op Note (Signed)
Preoperative Diagnosis: papilloma right breast   Postoprative Diagnosis: papilloma right breast   Procedure: Procedure(s): RIGHT BREAST LUMPECTOMY WITH RADIOACTIVE SEED LOCALIZATION   Surgeon: Excell Seltzer T   Assistants: None  Anesthesia:  General LMA anesthesia  Indications: On recent screening mammogram and subsequent ultrasound she was found to have a small, 7 mm mass in the right breast near the right areola. Large core needle biopsy has revealed papilloma. Complete excision was recommended. After discussion with the patient regarding surgery and options detailed elsewhere with electric proceed with complete excision to confirm diagnosis.    Procedure Detail:  3 days preoperatively the patient underwent accurate placement of radioactive seed at the clip site. In the holding area I confirmed placement of the seed with the neoprobe. Patient was then taken to the operating room, placed in the supine position on the operating table, and laryngeal mask general anesthesia induced. The right breast was widely sterilely prepped and draped. She received preoperative IV antibiotics. PAS were in place. Patient timeout was performed and correct procedure verified. Seed placement was verified by mammogram. The neoprobe was used to localize a hot area which was at about the 9:00 position at the areolar border. I made a circumareolar incision and dissection was carried down through the subcutaneous tissue for the breast capsule. Using the neoprobe for guidance a small specimen of breast tissue, about 1.5 cm in diameter was excised with cautery around the seed. There was a small palpable mass in this area. This was completely excised. The specimen was oriented. Specimen x-ray showed the seed and the marking clip centrally contained. This was sent for permanent pathology. Complete hemostasis was obtained in the wound. The breast and subcutaneous tissue was closed with interrupted 3-0 Vicryl and the skin  with a running subcuticular 5-0 Monocryl and Dermabond. Sponge needle and instrument counts were correct.    Findings: As above  Estimated Blood Loss:  Minimal         Drains: none  Blood Given: none          Specimens: Right breast lumpectomy, oriented        Complications:  * No complications entered in OR log *         Disposition: PACU - hemodynamically stable.         Condition: stable

## 2014-08-09 NOTE — Interval H&P Note (Signed)
History and Physical Interval Note:  08/09/2014 3:12 PM  Sharon Meyer  has presented today for surgery, with the diagnosis of papilloma right breast   The various methods of treatment have been discussed with the patient and family. After consideration of risks, benefits and other options for treatment, the patient has consented to  Procedure(s): RIGHT BREAST LUMPECTOMY WITH RADIOACTIVE SEED LOCALIZATION (Right) as a surgical intervention .  The patient's history has been reviewed, patient examined, no change in status, stable for surgery.  I have reviewed the patient's chart and labs.  Questions were answered to the patient's satisfaction.     Lukas Pelcher T

## 2014-08-09 NOTE — Anesthesia Postprocedure Evaluation (Signed)
  Anesthesia Post-op Note  Patient: Sharon Meyer  Procedure(s) Performed: Procedure(s): RIGHT BREAST LUMPECTOMY WITH RADIOACTIVE SEED LOCALIZATION (Right)  Patient Location: PACU  Anesthesia Type: General   Level of Consciousness: awake, alert  and oriented  Airway and Oxygen Therapy: Patient Spontanous Breathing  Post-op Pain: none  Post-op Assessment: Post-op Vital signs reviewed  Post-op Vital Signs: Reviewed  Last Vitals:  Filed Vitals:   08/09/14 1700  BP: 114/67  Pulse: 72  Temp:   Resp: 14    Complications: No apparent anesthesia complications

## 2014-08-09 NOTE — Anesthesia Preprocedure Evaluation (Signed)
Anesthesia Evaluation  Patient identified by MRN, date of birth, ID band Patient awake    Reviewed: Allergy & Precautions, NPO status , Patient's Chart, lab work & pertinent test results  Airway Mallampati: I  TM Distance: >3 FB Neck ROM: Full    Dental  (+) Teeth Intact, Dental Advisory Given   Pulmonary  breath sounds clear to auscultation        Cardiovascular Rhythm:Regular Rate:Normal     Neuro/Psych    GI/Hepatic   Endo/Other    Renal/GU      Musculoskeletal   Abdominal   Peds  Hematology   Anesthesia Other Findings   Reproductive/Obstetrics                             Anesthesia Physical Anesthesia Plan  ASA: II  Anesthesia Plan: General   Post-op Pain Management:    Induction: Intravenous  Airway Management Planned: LMA  Additional Equipment:   Intra-op Plan:   Post-operative Plan: Extubation in OR  Informed Consent: I have reviewed the patients History and Physical, chart, labs and discussed the procedure including the risks, benefits and alternatives for the proposed anesthesia with the patient or authorized representative who has indicated his/her understanding and acceptance.   Dental advisory given  Plan Discussed with: CRNA, Anesthesiologist and Surgeon  Anesthesia Plan Comments:         Anesthesia Quick Evaluation

## 2014-08-09 NOTE — H&P (Signed)
History of Present Illness Sharon Meyer T. Tashawnda Bleiler MD; 07/28/2014 5:18 PM) Patient words: breast mass.  The patient is a 44 year old female who presents with a complaint of nipple discharge. She is referred by Dr. Andres Meyer. She developed right nipple discharge in the fall of this year. She describes spontaneous clear discharge. She has had some previous fibrocystic disease noted on imaging but no previous biopsies. She presented for evaluation at the breast center. Mammograms show some benign calcifications on the left and there was a benign appearing oval 8 mm mass posteriorly in the right breast. Subsequent targeted ultrasound revealed a 7 mm subareolar mass at the 11 o'clock position of the right breast. The posterior 8 mm mass appeared benign. 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 She feels that her nipple discharges better since the biopsy. No significant family history of breast cancer.   Other Problems Sharon Meyer, CMA; 07/28/2014 4:21 PM) No pertinent past medical history  Past Surgical History Sharon Meyer, CMA; 07/28/2014 4:21 PM) Breast Biopsy Right.  Diagnostic Studies History Sharon Meyer, [place]; 07/28/2014 4:21 PM) Mammogram within last year Pap Smear 1-5 years ago  Allergies Sharon Meyer, CMA; 07/28/2014 4:22 PM) No Known Drug Allergies04/13/2016  Medication History Sharon Meyer, CMA; 07/28/2014 4:22 PM) Ferrous Sulfate (325 (65 Fe)MG Tablet, Oral) Active. Engerix-B (20MCG/ML Suspension, Injection) Active. Vitamin D (Ergocalciferol) (50000UNIT Capsule, Oral) Active. Medications Reconciled  Social History Sharon Meyer, [place]; 07/28/2014 4:21 PM) Alcohol use Remotely quit alcohol use. Caffeine use Carbonated beverages. No drug use Tobacco use Never smoker.  Family History Sharon Meyer, [place]; 07/28/2014 4:21 PM) Hypertension Mother. Migraine Headache Brother. Thyroid problems  Mother, Sister.  Pregnancy / Birth History Sharon Meyer, CMA; 07/28/2014 4:21 PM) Age at menarche 70 years. Gravida 4 Maternal age 72-20 Para 4 Regular periods  Review of Systems Sharon Meyer CMA; 07/28/2014 4:21 PM) General Present- Appetite Loss and Weight Loss. Not Present- Chills, Fatigue, Fever, Night Sweats and Weight Gain. Skin Not Present- Change in Wart/Mole, Dryness, Hives, Jaundice, New Lesions, Non-Healing Wounds, Rash and Ulcer. HEENT Present- Seasonal Allergies. Not Present- Earache, Hearing Loss, Hoarseness, Nose Bleed, Oral Ulcers, Ringing in the Ears, Sinus Pain, Sore Throat, Visual Disturbances, Wears glasses/contact lenses and Yellow Eyes. Respiratory Present- Snoring. Not Present- Bloody sputum, Chronic Cough, Difficulty Breathing and Wheezing. Breast Present- Breast Mass and Nipple Discharge. Not Present- Breast Pain and Skin Changes. Cardiovascular Not Present- Chest Pain, Difficulty Breathing Lying Down, Leg Cramps, Palpitations, Rapid Heart Rate, Shortness of Breath and Swelling of Extremities. Gastrointestinal Not Present- Abdominal Pain, Bloating, Bloody Stool, Change in Bowel Habits, Chronic diarrhea, Constipation, Difficulty Swallowing, Excessive gas, Gets full quickly at meals, Hemorrhoids, Indigestion, Nausea, Rectal Pain and Vomiting. Female Genitourinary Not Present- Frequency, Nocturia, Painful Urination, Pelvic Pain and Urgency. Musculoskeletal Not Present- Back Pain, Joint Pain, Joint Stiffness, Muscle Pain, Muscle Weakness and Swelling of Extremities. Neurological Present- Weakness. Not Present- Decreased Memory, Fainting, Headaches, Numbness, Seizures, Tingling, Tremor and Trouble walking. Psychiatric Not Present- Anxiety, Bipolar, Change in Sleep Pattern, Depression, Fearful and Frequent crying. Endocrine Not Present- Cold Intolerance, Excessive Hunger, Hair Changes, Heat Intolerance, Hot flashes and New Diabetes. Hematology Present- Easy Bruising. Not  Present- Excessive bleeding, Gland problems, HIV and Persistent Infections.   Vitals Sharon Meyer CMA; 07/28/2014 4:22 PM) 07/28/2014 4:22 PM Weight: 145 lb Height: 68in Body Surface Area: 1.78 m Body Mass Index: 22.05 kg/m Temp.: 98.68F(Oral)  Pulse: 76 (Regular)  Resp.: 16 (Unlabored)  BP:  132/68 (Sitting, Left Arm, Standard)    Physical Exam Sharon Meyer T. Sharon Plourde MD; 07/28/2014 5:19 PM) The physical exam findings are as follows: Note:General: Thin African-American American female appears well Lymph nodes: No cervical, supra or ventricular or axillary nodes palpable Breasts: There is some thickening in the lateral right periareolar space which she says is just since biopsy. There is easily elicited single duct clear right nipple discharge. No other palpable abnormalities in either breast    Assessment & Plan Sharon Meyer T. 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 I discussed the diagnosis and options with the patient. We discussed there is a small incidence of underlying early stage breast cancer that could be excluded only with excisional biopsy. This is low risk and options of close follow-up with imaging was discussed. She strongly prefers to have this removed. We discussed the nature of the surgery and expected recovery as well as risks of bleeding and infection. Current Plans  Schedule for Surgery Radioactive seed localized right breast lumpectomy

## 2014-08-10 ENCOUNTER — Encounter (HOSPITAL_BASED_OUTPATIENT_CLINIC_OR_DEPARTMENT_OTHER): Payer: Self-pay | Admitting: General Surgery

## 2014-10-19 ENCOUNTER — Other Ambulatory Visit: Payer: BLUE CROSS/BLUE SHIELD

## 2014-10-19 ENCOUNTER — Ambulatory Visit: Payer: BLUE CROSS/BLUE SHIELD | Admitting: Oncology

## 2017-09-27 ENCOUNTER — Encounter (HOSPITAL_BASED_OUTPATIENT_CLINIC_OR_DEPARTMENT_OTHER): Payer: Self-pay | Admitting: *Deleted

## 2017-09-27 ENCOUNTER — Emergency Department (HOSPITAL_BASED_OUTPATIENT_CLINIC_OR_DEPARTMENT_OTHER): Payer: Worker's Compensation

## 2017-09-27 ENCOUNTER — Other Ambulatory Visit: Payer: Self-pay

## 2017-09-27 ENCOUNTER — Emergency Department (HOSPITAL_BASED_OUTPATIENT_CLINIC_OR_DEPARTMENT_OTHER)
Admission: EM | Admit: 2017-09-27 | Discharge: 2017-09-27 | Disposition: A | Discharge status: Home / Self Care | Payer: Worker's Compensation | Attending: Emergency Medicine | Admitting: Emergency Medicine

## 2017-09-27 DIAGNOSIS — Y9289 Other specified places as the place of occurrence of the external cause: Secondary | ICD-10-CM | POA: Insufficient documentation

## 2017-09-27 DIAGNOSIS — M545 Low back pain, unspecified: Secondary | ICD-10-CM

## 2017-09-27 DIAGNOSIS — Y990 Civilian activity done for income or pay: Secondary | ICD-10-CM | POA: Diagnosis not present

## 2017-09-27 DIAGNOSIS — Z853 Personal history of malignant neoplasm of breast: Secondary | ICD-10-CM | POA: Insufficient documentation

## 2017-09-27 DIAGNOSIS — Y9389 Activity, other specified: Secondary | ICD-10-CM | POA: Insufficient documentation

## 2017-09-27 DIAGNOSIS — Z79899 Other long term (current) drug therapy: Secondary | ICD-10-CM | POA: Insufficient documentation

## 2017-09-27 DIAGNOSIS — X500XXA Overexertion from strenuous movement or load, initial encounter: Secondary | ICD-10-CM | POA: Insufficient documentation

## 2017-09-27 MED ORDER — CYCLOBENZAPRINE HCL 10 MG PO TABS: 5.0000 mg | ORAL_TABLET | Freq: Every day | ORAL | 0 refills | Status: DC

## 2017-09-27 MED ORDER — NAPROXEN 500 MG PO TABS: 500.0000 mg | ORAL_TABLET | Freq: Two times a day (BID) | ORAL | 0 refills | Status: DC

## 2017-09-27 MED FILL — NAPROXEN 500 MG TABLET: 500 | 7 days supply | Qty: 14 | Fill #0

## 2017-09-27 MED FILL — CYCLOBENZAPRINE HCL 10 MG T: 10 | 10 days supply | Qty: 10 | Fill #0

## 2017-09-27 NOTE — Discharge Instructions (Signed)
Avoid heavy lifting, you can use muscle rubs such as Biofreeze or icy hot and pain patches anything that has lidocaine as an ingredient to help with the pain.  You need to return to the emergency room if you develop weakness or numbness down one leg or any difficulty urinating or having bowel movements.

## 2017-09-27 NOTE — ED Provider Notes (Signed)
[facility] EMERGENCY DEPARTMENT Provider Note   CSN: [id number] Arrival date & time: 09/27/17  1152     History   Chief Complaint Chief Complaint  Patient presents with  . Back Pain    HPI Sharon Meyer is a 47 y.o. female.  Patient is a 47 year old female with a history of anemia who is presenting today with low back pain that started 2 days ago.  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  2 days ago she had an 8000 pound load that she was unpacking.  Weight of objects ranged between 10 pounds to 47 pounds.  Halfway through unloading the shipment she developed pain in the middle of her lower back.  Is worse with bending twisting and lifting.  This is persisted over the last 2 days.  The pain is present constantly but more stiffness when she wakes up in the morning.  She has not taken any medication for this including over-the-counter medication.  She denies any radiation of pain into the legs and denies any neurologic symptoms.  The history is provided by the patient.  Back Pain   This is a new problem. The current episode started 2 days ago. The problem occurs constantly. The problem has not changed since onset.The pain is associated with lifting heavy objects. The pain is present in the lumbar spine. The quality of the pain is described as aching. The pain does not radiate. The pain is at a severity of 5/10. The pain is moderate. The symptoms are aggravated by bending, twisting and certain positions. The pain is the same all the time. Stiffness is present in the morning. Pertinent negatives include no fever, no numbness, no bowel incontinence, no bladder incontinence, no dysuria, no leg pain, no paresthesias, no paresis, no tingling and no weakness. She has tried nothing for the symptoms. The treatment provided no relief.    Past Medical History:  Diagnosis Date  . Anemia   . Cancer [facility])    Right breast cancer    There are no active problems to  display for this patient.   Past Surgical History:  Procedure Laterality Date  . BREAST LUMPECTOMY WITH RADIOACTIVE SEED LOCALIZATION Right 08/09/2014   Procedure: RIGHT BREAST LUMPECTOMY WITH RADIOACTIVE SEED LOCALIZATION;  Surgeon: Excell Seltzer, MD;  Location: [facility];  Service: General;  Laterality: Right;  . btl    . HERNIA REPAIR     umbilical hernia     OB History   None      Home Medications    Prior to Admission medications   Medication Sig Start Date End Date Taking? Authorizing Provider  ferrous sulfate 325 (65 FE) MG tablet Take 325 mg by mouth 3 (three) times daily. 06/23/14   [provider]  HYDROcodone-acetaminophen (NORCO/VICODIN) 5-325 MG per tablet Take 1-2 tablets by mouth every 4 (four) hours as needed for moderate pain or severe pain. 08/09/14   Excell Seltzer, MD  Vitamin D, Ergocalciferol, (DRISDOL) 50000 UNITS CAPS capsule Take 50,000 Units by mouth once a week. 06/25/14   [provider]    Family History No family history on file.  Social History Social History   Tobacco Use  . Smoking status: Never Smoker  . Smokeless tobacco: Never Used  Substance Use Topics  . Alcohol use: No  . Drug use: No     Allergies   Patient has no known allergies.   Review of Systems Review of Systems  Constitutional: Negative for fever.  Gastrointestinal: Negative for bowel incontinence.  Genitourinary: Negative for bladder incontinence and dysuria.  Musculoskeletal: Positive for back pain.  Neurological: Negative for tingling, weakness, numbness and paresthesias.  All other systems reviewed and are negative.    Physical Exam Updated Vital Signs BP 126/80   Pulse 82   Temp 98.5 F (36.9 C) (Oral)   Resp 16   Ht 5\' 6"  (1.676 m)   Wt 89.8 kg (198 lb)   LMP 09/10/2017   SpO2 100%   BMI 31.96 kg/m   Physical Exam  Constitutional: She is oriented to person, place, and time. She appears well-developed and  well-nourished. No distress.  HENT:  Head: Normocephalic and atraumatic.  Mouth/Throat: Oropharynx is clear and moist.  Eyes: Pupils are equal, round, and reactive to light. Conjunctivae and EOM are normal.  Neck: Normal range of motion. Neck supple.  Cardiovascular: Normal rate and intact distal pulses.  Pulmonary/Chest: Effort normal.  Abdominal: Soft. She exhibits no distension. There is no tenderness. There is no rebound and no guarding.  Musculoskeletal: Normal range of motion. She exhibits no edema or tenderness.       Lumbar back: She exhibits bony tenderness. She exhibits normal range of motion, no deformity, no spasm and normal pulse.       Back:  Neurological: She is alert and oriented to person, place, and time. She has normal strength. No sensory deficit.  5 out of 5 strength in bilateral lower extremities  Skin: Skin is warm and dry. No rash noted. No erythema.  Psychiatric: She has a normal mood and affect. Her behavior is normal.  Nursing note and vitals reviewed.    ED Treatments / Results  Labs (all labs ordered are listed, but only abnormal results are displayed) Labs Reviewed - No data to display  EKG None  Radiology Dg Lumbar Spine Complete  Result Date: 09/27/2017 CLINICAL DATA:  Low back pain for the past 3 days. No definite injury, however has been doing a lot of lifting lately. EXAM: LUMBAR SPINE - COMPLETE 4+ VIEW COMPARISON:  None. FINDINGS: No acute fracture or subluxation. Vertebral body heights are preserved. Alignment is normal. Mild disc height loss at L5-S1. Remaining intervertebral disc spaces are maintained. The sacroiliac joints are unremarkable. 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 Electronically Signed   By: Titus Dubin M.D.   On: 09/27/2017 13:04    Procedures Procedures (including critical care time)  Medications Ordered in ED Medications - No data to display   Initial Impression / Assessment and Plan / ED Course  I  have reviewed the triage vital signs and the nursing notes.  Pertinent labs & imaging results that were available during my care of the patient were reviewed by me and considered in my medical decision making (see chart for details).     Recent presenting today with pain in the middle of her lumbar spine associated with lifting.  She has no radicular symptoms or neurologic symptoms at this time.  Low suspicion for cauda equina, metastatic disease of the spine, epidural hematoma or abscess or discitis.  We will do a plain film to ensure no evidence of compression fracture but feel its most likely strain from overuse.  Patient has taken no medications for the pain and feel she would benefit from NSAIDs and muscle relaxer.  Also will give her Voltaren gel.  2:07 PM X-ray without any sign of compression fracture.  Only showing some degenerative disease at L5-S1.  Patient will be treated for strain and given follow-up as needed.  Given NSAIDs and muscle relaxers.  Final Clinical Impressions(s) / ED Diagnoses   Final diagnoses:  Acute midline low back pain without sciatica    ED Discharge Orders        Ordered    naproxen (NAPROSYN) 500 MG tablet  2 times daily     09/27/17 1404    cyclobenzaprine (FLEXERIL) 10 MG tablet  Daily at bedtime     09/27/17 1404       Blanchie Dessert, MD 09/27/17 1408

## 2017-09-27 NOTE — ED Triage Notes (Signed)
Lower back pain. Injury at work 2 days ago.

## 2017-10-04 ENCOUNTER — Other Ambulatory Visit: Payer: Self-pay

## 2017-10-04 ENCOUNTER — Emergency Department (HOSPITAL_BASED_OUTPATIENT_CLINIC_OR_DEPARTMENT_OTHER)
Admission: EM | Admit: 2017-10-04 | Discharge: 2017-10-04 | Disposition: A | Discharge status: Home / Self Care | Payer: Worker's Compensation | Attending: Emergency Medicine | Admitting: Emergency Medicine

## 2017-10-04 ENCOUNTER — Encounter (HOSPITAL_BASED_OUTPATIENT_CLINIC_OR_DEPARTMENT_OTHER): Payer: Self-pay | Admitting: Emergency Medicine

## 2017-10-04 DIAGNOSIS — S39012D Strain of muscle, fascia and tendon of lower back, subsequent encounter: Secondary | ICD-10-CM | POA: Diagnosis not present

## 2017-10-04 DIAGNOSIS — X509XXA Other and unspecified overexertion or strenuous movements or postures, initial encounter: Secondary | ICD-10-CM | POA: Diagnosis not present

## 2017-10-04 DIAGNOSIS — Z79899 Other long term (current) drug therapy: Secondary | ICD-10-CM | POA: Diagnosis not present

## 2017-10-04 DIAGNOSIS — Z853 Personal history of malignant neoplasm of breast: Secondary | ICD-10-CM | POA: Diagnosis not present

## 2017-10-04 NOTE — ED Provider Notes (Signed)
[facility] EMERGENCY DEPARTMENT Provider Note   CSN: [id number] Arrival date & time: 10/04/17  1331     History   Chief Complaint Chief Complaint  Patient presents with  . Back Pain    HPI Sharon Meyer is a 47 y.o. female presenting for evaluation of back pain and for note to return to work.   Pt states she was seen in the ED last week for back pain that was from lifting at work. She needs a note saying she may return to work. She reports improvement of her sxs, has continued intermittent pain with certain movements. She denies HA, fevers, loss of bowel or bladder control, numbness, or difficulty walking. She has been taking muscle relaxer and pain reliever as prescribed. No new or worsening symptoms.     HPI  Past Medical History:  Diagnosis Date  . Anemia   . Cancer [facility])    Right breast cancer    There are no active problems to display for this patient.   Past Surgical History:  Procedure Laterality Date  . BREAST LUMPECTOMY WITH RADIOACTIVE SEED LOCALIZATION Right 08/09/2014   Procedure: RIGHT BREAST LUMPECTOMY WITH RADIOACTIVE SEED LOCALIZATION;  Surgeon: Excell Seltzer, MD;  Location: [facility];  Service: General;  Laterality: Right;  . btl    . HERNIA REPAIR     umbilical hernia     OB History   None      Home Medications    Prior to Admission medications   Medication Sig Start Date End Date Taking? Authorizing Provider  cyclobenzaprine (FLEXERIL) 10 MG tablet Take 0.5-1 tablets (5-10 mg total) by mouth at bedtime. 09/27/17   Blanchie Dessert, MD  ferrous sulfate 325 (65 FE) MG tablet Take 325 mg by mouth 3 (three) times daily. 06/23/14   [provider]  HYDROcodone-acetaminophen (NORCO/VICODIN) 5-325 MG per tablet Take 1-2 tablets by mouth every 4 (four) hours as needed for moderate pain or severe pain. 08/09/14   Excell Seltzer, MD  naproxen (NAPROSYN) 500 MG tablet Take 1 tablet (500 mg total) by mouth  2 (two) times daily. 09/27/17   Blanchie Dessert, MD  Vitamin D, Ergocalciferol, (DRISDOL) 50000 UNITS CAPS capsule Take 50,000 Units by mouth once a week. 06/25/14   [provider]    Family History No family history on file.  Social History Social History   Tobacco Use  . Smoking status: Never Smoker  . Smokeless tobacco: Never Used  Substance Use Topics  . Alcohol use: No  . Drug use: No     Allergies   Patient has no known allergies.   Review of Systems Review of Systems  Musculoskeletal: Positive for back pain (improving, intermittent).     Physical Exam Updated Vital Signs BP 127/65 (BP Location: Left Arm)   Pulse 87   Temp 99.3 F (37.4 C) (Oral)   Resp 16   Ht 5\' 6"  (1.676 m)   Wt 89.8 kg (198 lb)   LMP 09/10/2017   SpO2 100%   BMI 31.96 kg/m   Physical Exam  Constitutional: She is oriented to person, place, and time. She appears well-developed and well-nourished. No distress.  appears in no distress  HENT:  Head: Normocephalic and atraumatic.  Eyes: EOM are normal.  Neck: Normal range of motion. Neck supple.  No ttp of c-spine. No step offs  Cardiovascular: Normal rate, regular rhythm and intact distal pulses.  Pulmonary/Chest: Effort normal and breath sounds normal. No respiratory distress. She  has no wheezes.  Abdominal: She exhibits no distension.  Musculoskeletal: Normal range of motion. She exhibits tenderness. She exhibits no deformity.  No ttp of midline spine. Mild soreness with palpation of bilateral low back musculature. Full acitve ROM of hips and back without pain. Strength intact x4. Sensation intact x4. radial and pedal pulses intact bilaterally. Ambulatory without difficulty.   Neurological: She is alert and oriented to person, place, and time. No sensory deficit.  Skin: Skin is warm. No rash noted.  Psychiatric: She has a normal mood and affect.  Nursing note and vitals reviewed.    ED Treatments / Results  Labs (all  labs ordered are listed, but only abnormal results are displayed) Labs Reviewed - No data to display  EKG None  Radiology No results found.  Procedures Procedures (including critical care time)  Medications Ordered in ED Medications - No data to display   Initial Impression / Assessment and Plan / ED Course  I have reviewed the triage vital signs and the nursing notes.  Pertinent labs & imaging results that were available during my care of the patient were reviewed by me and considered in my medical decision making (see chart for details).     Pt presenting for reevaluation of low back pain and requesting note saying she may return to work. Discussed with pt that I can write her back to work, but cannot fill out [employer] comp paperwork- this needs to be done by PCP or occupational health. on exam, neurovascularly intact. notes prom previous visit reviewed, pt dx with muscle strain, no bony or neuro involvement. Discussed with pt that she may return to work, but will likely have continued pain for several weeks. Recommended back brace. At this time, pt appears safe for d/c. Return precautions given. Pt states she understands and agrees to plan.    Final Clinical Impressions(s) / ED Diagnoses   Final diagnoses:  Strain of lumbar region, subsequent encounter    ED Discharge Orders    None       Franchot Heidelberg, PA-C 10/04/17 2024    Margette Fast, MD 10/05/17 [phone]

## 2017-10-04 NOTE — Discharge Instructions (Addendum)
Continue using muscle relaxer and other medications as needed. Consider using a back brace to help with support.  For workman's comp paperwork, follow up with your primary care doctor or occupational health. Return to the ER if you develop loss of bowel or bladder control, numbness, severe worsening pain, or any new or concerning symptoms.

## 2017-10-04 NOTE — ED Triage Notes (Signed)
Pt here for followup of workers comp back injury.

## 2018-01-20 ENCOUNTER — Ambulatory Visit (HOSPITAL_COMMUNITY)
Admission: EM | Admit: 2018-01-20 | Discharge: 2018-01-20 | Disposition: A | Discharge status: Home / Self Care | Payer: BLUE CROSS/BLUE SHIELD

## 2018-01-20 ENCOUNTER — Encounter (HOSPITAL_COMMUNITY): Payer: Self-pay

## 2018-01-20 DIAGNOSIS — N6452 Nipple discharge: Secondary | ICD-10-CM | POA: Diagnosis not present

## 2018-01-20 NOTE — ED Provider Notes (Signed)
[place]    CSN: [id number] Arrival date & time: 01/20/18  0867     History   Chief Complaint No chief complaint on file.   HPI Sharon Meyer is a 47 y.o. female.   Sharon Meyer presents with complaints of leaking from right breast. She woke with this yesterday, saturated her shirt. Yellow in color. Small amount today as well. No pain to breast. No fevers. Still having periods, last was last week. Had similar three years ago which required lumpectomy. History of breast cancer in her family- her dad's sister. Doesn't smoke. Not breast feeding. Last mammogram two years ago. No new palpable lumps or bumps. Hx of anemia and lumpectomy with radioactive seed to right breast.     ROS per HPI.      Past Medical History:  Diagnosis Date  . Anemia   . Cancer [facility])    Right breast cancer    There are no active problems to display for this patient.   Past Surgical History:  Procedure Laterality Date  . BREAST LUMPECTOMY WITH RADIOACTIVE SEED LOCALIZATION Right 08/09/2014   Procedure: RIGHT BREAST LUMPECTOMY WITH RADIOACTIVE SEED LOCALIZATION;  Surgeon: Excell Seltzer, MD;  Location: [facility];  Service: General;  Laterality: Right;  . btl    . HERNIA REPAIR     umbilical hernia    OB History   None      Home Medications    Prior to Admission medications   Medication Sig Start Date End Date Taking? Authorizing Provider  cyclobenzaprine (FLEXERIL) 10 MG tablet Take 0.5-1 tablets (5-10 mg total) by mouth at bedtime. 09/27/17   Blanchie Dessert, MD  ferrous sulfate 325 (65 FE) MG tablet Take 325 mg by mouth 3 (three) times daily. 06/23/14   [provider]  HYDROcodone-acetaminophen (NORCO/VICODIN) 5-325 MG per tablet Take 1-2 tablets by mouth every 4 (four) hours as needed for moderate pain or severe pain. 08/09/14   Excell Seltzer, MD  naproxen (NAPROSYN) 500 MG tablet Take 1 tablet (500 mg total) by mouth 2 (two) times daily.  09/27/17   Blanchie Dessert, MD  Vitamin D, Ergocalciferol, (DRISDOL) 50000 UNITS CAPS capsule Take 50,000 Units by mouth once a week. 06/25/14   [provider]    Family History Family History  Problem Relation Age of Onset  . Healthy Mother   . Healthy Father     Social History Social History   Tobacco Use  . Smoking status: Never Smoker  . Smokeless tobacco: Never Used  Substance Use Topics  . Alcohol use: No  . Drug use: No     Allergies   Patient has no known allergies.   Review of Systems Review of Systems   Physical Exam Triage Vital Signs ED Triage Vitals  Enc Vitals Group     BP 01/20/18 0855 126/90     Pulse Rate 01/20/18 0855 99     Resp 01/20/18 0855 16     Temp 01/20/18 0855 98.3 F (36.8 C)     Temp Source 01/20/18 0855 Oral     SpO2 01/20/18 0855 100 %     Weight --      Height --      Head Circumference --      Peak Flow --      Pain Score 01/20/18 0919 0     Pain Loc --      Pain Edu? --      Excl. in [place]? --  No data found.  Updated Vital Signs BP 126/90 (BP Location: Left Arm)   Pulse 99   Temp 98.3 F (36.8 C) (Oral)   Resp 16   LMP 01/16/2018   SpO2 100%    Physical Exam  Constitutional: She is oriented to person, place, and time. She appears well-developed and well-nourished. No distress.  Cardiovascular: Normal rate, regular rhythm and normal heart sounds.  Pulmonary/Chest: Effort normal and breath sounds normal. Right breast exhibits tenderness. Right breast exhibits no inverted nipple, no mass, no nipple discharge and no skin change. No breast swelling.  No current discharge; some tenderness on palpation at 8 o clock of nipple without palpable mass; pea sized mass at approximately 10 oclock of nipple, non tender, mobile, rubbery     Neurological: She is alert and oriented to person, place, and time.  Skin: Skin is warm and dry.     UC Treatments / Results  Labs (all labs ordered are listed, but only  abnormal results are displayed) Labs Reviewed - No data to display  EKG None  Radiology No results found.  Procedures Procedures (including critical care time)  Medications Ordered in UC Medications - No data to display  Initial Impression / Assessment and Plan / UC Course  I have reviewed the triage vital signs and the nursing notes.  Pertinent labs & imaging results that were available during my care of the patient were reviewed by me and considered in my medical decision making (see chart for details).     Referral/order completed for diagnostic mammography of right breast. Encouraged establish with PCP for management as needed. Patient verbalized understanding and agreeable to plan.   Final Clinical Impressions(s) / UC Diagnoses   Final diagnoses:  Nipple discharge     Discharge Instructions     You will receive a call to schedule imaging for further evaluation of this discharge. Please let us know if you don't hear by the end of this week. Please establish with a primary care provider for follow up as needed.    ED Prescriptions    None     Controlled Substance Prescriptions [place] Controlled Substance Registry consulted? Not Applicable   Zigmund Gottron, NP 01/20/18 [phone]

## 2018-01-20 NOTE — Discharge Instructions (Signed)
You will receive a call to schedule imaging for further evaluation of this discharge. Please let us know if you don't hear by the end of this week. Please establish with a primary care provider for follow up as needed.

## 2018-01-20 NOTE — ED Triage Notes (Signed)
Pt presents with leakage to her right breast. Reports history of this one year ago. Started again yesterday. Denies any pain.

## 2018-01-21 ENCOUNTER — Other Ambulatory Visit: Payer: Self-pay | Admitting: Emergency Medicine

## 2018-01-21 DIAGNOSIS — N6452 Nipple discharge: Secondary | ICD-10-CM

## 2018-01-27 ENCOUNTER — Encounter: Payer: Self-pay | Admitting: Family Medicine

## 2018-01-27 ENCOUNTER — Ambulatory Visit
Admission: RE | Admit: 2018-01-27 | Discharge: 2018-01-27 | Disposition: A | Discharge status: Home / Self Care | Payer: BLUE CROSS/BLUE SHIELD | Source: Ambulatory Visit | Attending: Emergency Medicine | Admitting: Emergency Medicine

## 2018-01-27 DIAGNOSIS — N6452 Nipple discharge: Secondary | ICD-10-CM

## 2018-01-27 DIAGNOSIS — N6489 Other specified disorders of breast: Secondary | ICD-10-CM | POA: Diagnosis not present

## 2018-01-27 DIAGNOSIS — R922 Inconclusive mammogram: Secondary | ICD-10-CM | POA: Diagnosis not present

## 2018-01-28 ENCOUNTER — Encounter: Payer: Self-pay | Admitting: Family Medicine

## 2018-01-28 ENCOUNTER — Ambulatory Visit (INDEPENDENT_AMBULATORY_CARE_PROVIDER_SITE_OTHER): Payer: BLUE CROSS/BLUE SHIELD | Admitting: Family Medicine

## 2018-01-28 VITALS — BP 117/71 | HR 90 | Temp 98.7°F | Resp 17 | Ht 67.0 in | Wt 181.4 lb

## 2018-01-28 DIAGNOSIS — D649 Anemia, unspecified: Secondary | ICD-10-CM | POA: Diagnosis not present

## 2018-01-28 DIAGNOSIS — Z13220 Encounter for screening for lipoid disorders: Secondary | ICD-10-CM | POA: Diagnosis not present

## 2018-01-28 DIAGNOSIS — R928 Other abnormal and inconclusive findings on diagnostic imaging of breast: Secondary | ICD-10-CM | POA: Diagnosis not present

## 2018-01-28 DIAGNOSIS — Z1329 Encounter for screening for other suspected endocrine disorder: Secondary | ICD-10-CM

## 2018-01-28 DIAGNOSIS — D241 Benign neoplasm of right breast: Secondary | ICD-10-CM

## 2018-01-28 DIAGNOSIS — Z131 Encounter for screening for diabetes mellitus: Secondary | ICD-10-CM

## 2018-01-28 MED ORDER — HYDROXYZINE PAMOATE 100 MG PO CAPS: ORAL_CAPSULE | ORAL | 0 refills | Status: DC

## 2018-01-28 NOTE — Progress Notes (Signed)
Sharon Meyer, is a 47 y.o. female  FUX:[id number]  GUR:[id number]  DOB - Nov 12, 1970  CC:  Chief Complaint  Patient presents with  . Establish Care  . Follow-up    follow up on UC visit & imaging.       HPI: Sharon Meyer is a 47 y.o. female is here today to establish care.   Sharon Meyer  Right breast lumpectomy, right breast discharge,  of right breast.  Today's visit:  Sharon Meyer presented to the ER on 01/20/2018 with a complaint of right breast leakage. At that time, drainage from right breast was yellow and persistent. These are the same symptoms patient experienced in 07/2014, which required lumpectomy surgery. 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     Patient denies new headaches, chest pain, abdominal pain, nausea, new weakness , numbness or tingling, SOB, edema, or worrisome cough. .  Current medications: Current Outpatient Medications:  .  hydrOXYzine (VISTARIL) 100 MG capsule, Take one dose 1 hour prior to MRI . You may repeat dose x 1, Disp: 5 capsule, Rfl: 0   Pertinent family medical history: family history includes father with prostate cancer  and mother otherwise healthy. No family hx of 1st degree relative of diabetes, cardiovascular disease, or lung disease.   No Known Allergies  Social History   Socioeconomic History  . Marital status: Single    Spouse name: Not on file  . Number of children: Not on file  . Years of education: Not on file  . Highest education level: Not on file  Occupational History  . Not on file  Social Needs  . Financial resource strain: Not on file  . Food insecurity:    Worry: Not on file    Inability: Not on file  . Transportation needs:    Medical: Not on file    Non-medical: Not on file  Tobacco Use  . Smoking status: Never Smoker  . Smokeless tobacco: Never Used  Substance and Sexual  Activity  . Alcohol use: No  . Drug use: No  . Sexual activity: Never  Lifestyle  . Physical activity:    Days per week: Not on file    Minutes per session: Not on file  . Stress: Not on file  Relationships  . Social connections:    Talks on phone: Not on file    Gets together: Not on file    Attends religious service: Not on file    Active member of club or organization: Not on file    Attends meetings of clubs or organizations: Not on file    Relationship status: Not on file  . Intimate partner violence:    Fear of current or ex partner: Not on file    Emotionally abused: Not on file    Physically abused: Not on file    Forced sexual activity: Not on file  Other Topics Concern  . Not on file  Social History Narrative  . Not on file    Review of Systems: Constitutional: Negative for fever, chills, diaphoresis, activity change, appetite change and fatigue. HENT: Negative for ear pain, nosebleeds, congestion, facial swelling, rhinorrhea, neck pain, neck stiffness and ear discharge.  Eyes: Negative for pain, discharge, redness, itching and visual disturbance. Respiratory: Negative for cough, choking, chest tightness, shortness of breath, wheezing and stridor.  Cardiovascular: Negative for chest pain, palpitations and leg swelling. Gastrointestinal: Negative for abdominal distention.  Genitourinary: Negative for dysuria, urgency, frequency, hematuria, flank pain, decreased urine volume, difficulty urinating. Musculoskeletal: Negative for back pain, joint swelling, arthralgia and gait problem. Neurological: Negative for dizziness, tremors, seizures, syncope, facial asymmetry, speech difficulty, weakness, light-headedness, numbness and headaches.  Hematological: Negative for adenopathy. Does not bruise/bleed easily. Psychiatric/Behavioral: Negative for hallucinations, behavioral problems, confusion, dysphoric mood, decreased concentration and agitation.    Objective:   Vitals:    01/28/18 1451  BP: 117/71  Pulse: 90  Resp: 17  Temp: 98.7 F (37.1 C)  SpO2: 96%    BP Readings from Last 3 Encounters:  01/28/18 117/71  01/20/18 126/90  10/04/17 127/65    Filed Weights   01/28/18 1451  Weight: 181 lb 6.4 oz (82.3 kg)      Physical Exam: Constitutional: Patient appears well-developed and well-nourished. No distress. HENT: Normocephalic, atraumatic, External right and left ear normal. Oropharynx is clear and moist.  Eyes: Conjunctivae and EOM are normal. PERRLA, no scleral icterus. Neck: Normal ROM. Neck supple. No JVD. No tracheal deviation. No thyromegaly. CVS: RRR, S1/S2 +, no murmurs, no gallops, no carotid bruit.  Pulmonary: Effort and breath sounds normal, no stridor, rhonchi, wheezes, rales. Breast Exam: No masses present,   Abdominal: Soft. BS +, no distension, tenderness, rebound or guarding.  Musculoskeletal: Normal range of motion. No edema and no tenderness.  Skin: Skin is warm and dry. No rash noted. Not diaphoretic. No erythema. No pallor. Psychiatric: Normal mood and affect. Behavior, judgment, thought content normal.  Lab Results (prior encounters)  Lab Results  Component Value Date   WBC 5.2 07/20/2014   HGB 10.1 (L) 08/09/2014   HCT 30.2 (L) 07/20/2014   MCV 70.7 (L) 07/20/2014   PLT 626 (H) 07/20/2014   Lab Results  Component Value Date   CREATININE 0.7 07/20/2014   BUN 7.3 07/20/2014   NA 140 07/20/2014   K 3.7 07/20/2014   CL 105 06/22/2014   CO2 23 07/20/2014    No results found for: HGBA1C  No results found for: CHOL, TRIG, HDL, CHOLHDL, VLDL, LDLCALC      Assessment and plan:  1. Abnormality of breast on screening mammography, recent abnormal right breast mammogram. Ordered recommended  MR BREAST BILATERAL W [place] CAD for further evaluation. If abnormal, will refer patient back to breast center for additional evaluation.  2. Anemia, unspecified type, iron deficiency, is documented in the past. Checking    CBC with Differential; Iron, TIBC and Ferritin Panel   3. Screening for diabetes mellitus - Hemoglobin A1c and CMP  4. Screening cholesterol level - Lipid panel  5. Screening for thyroid disorder - Thyroid Panel With TSH  RTC: CPE within 6 weeks.   The patient was given clear instructions to go to ER or return to medical center if symptoms don't improve, worsen or new problems develop. The patient verbalized understanding. The patient was advised  to call and obtain lab results if they haven't heard anything from out office within 7-10 business days.  Molli Barrows, FNP Primary Care at [facility] [address] [place] [address] [phone]: [phone]    This note has been created with Dragon speech recognition software and Engineer, materials. Any transcriptional errors are unintentional.

## 2018-01-28 NOTE — Patient Instructions (Signed)
Thank you for choosing Primary Care at [facility] for your medical home!    Sharon Meyer was seen by Molli Barrows, FNP today.   Sharon Meyer's primary care provider is Scot Jun, FNP.   For the best care possible,  you should try to see Molli Barrows, FNP whenever you come to clinic.   We look forward to seeing you again soon!  If you have any questions about your visit today,  please call us at   Or feel free to reach your provider via [place].     Magnetic Resonance Cholangiopancreatogram Magnetic resonance imaging (MRI) is a type of procedure that is used to produce pictures of the inside of the body without using X-rays. Instead, strong magnets and radio waves work together in a [employer] officer, political party to form very detailed and sharp images. The images are viewed on a TV monitor in two-dimensional and three-dimensional form. The magnets and the radio waves are harmless. Magnetic resonance cholangiopancreatogram, or magnetic resonance cholangiopancreatography (MRCP), is an MRI that is done on your gallbladder, bile duct, pancreas, and pancreatic duct. MRCP produces detailed images of these organs. It can be used to help diagnose problems such as tumors, stones, infection, or inflammation. It is sometimes used to help determine the cause of pancreatitis or belly (abdominal) pain. Contrast material may be injected to make MRCP images even more clear. Tell a health care provider about:  Any allergies you have.  All medicines you are taking, including vitamins, herbs, eye drops, creams, and over-the-counter medicines.  Any surgeries you have had.  Medical conditions you have, including kidney disease.  Any metal you may have in your body. The magnet used in this procedure can cause metal objects in your body to move. Metal can also make it hard to get high-quality images. Objects that contain metal include: ? A pacemaker or any other implants, such  as an implanted neurostimulator, a metallic ear implant, or a metallic object within the eye socket. ? Metal splinters. ? Any bullet fragments. ? A port for delivering insulin or chemotherapy.  Any tattoos you have. Some red dyes contain iron, which is sometimes a problem.  If you are pregnant or you think that you may be pregnant. It is best to avoid this test during the first 3 months of pregnancy unless there is a significant risk of missing a serious diagnosis without performing the test.  If you are breastfeeding.  If you are afraid of cramped spaces (claustrophobic). If claustrophobia is a problem for you, it can usually be relieved with mild sedatives or antianxiety medicines. What happens before the procedure?  You will be asked to remove anything that contains metal, such as a watch or any jewelry that you are wearing. You may also be asked to remove any makeup because some makeup contains traces of metal. Braces and fillings are usually not a problem.  Women who are breastfeeding may need to pump breast milk before the exam so that they have milk to give to their baby until the contrast material (if used) has cleared from their body. What happens during the procedure?  You may be given earplugs because the machine that is used can be noisy. Headphones may also be available so you can listen to music.  If a contrast material will be used, an IV tube will be inserted into one of your veins. The contrast material will be injected through the tube.  You will lie down on a  platform.  The platform will slide into a long, magnetic chamber. When you are inside the chamber, you will still be able to talk to the health care provider.  You will be asked to lie very still. The health care provider will tell you when you can shift position. You may have to wait a few minutes to make sure that the images produced during the procedure are readable. The procedure may vary among health care  providers and hospitals. What happens after the procedure?  Return to your normal activities as directed by your health care provider.  If contrast material was used, it will pass from your body within a day.  A health care provider who is experienced in MRCP will analyze the results and send a report and an interpretation of the findings to your health care provider.  It is your responsibility to obtain your test results. Ask your health care provider or the department performing the test when and how you will get your results. This information is not intended to replace advice given to you by your health care provider. Make sure you discuss any questions you have with your health care provider. Document Released: 09/19/2007 Document Revised: 09/08/2015 Document Reviewed: 01/12/2014 Elsevier Interactive Patient Education  [employer].

## 2018-01-29 ENCOUNTER — Telehealth: Payer: Self-pay

## 2018-01-29 NOTE — Telephone Encounter (Signed)
MRI scheduled for 02/06/18 @ 1 PM.  Patient called & notified of appt information.

## 2018-01-29 NOTE — Telephone Encounter (Signed)
Per insurance CPT code [phone] does not require a prior authorization

## 2018-01-29 NOTE — Telephone Encounter (Signed)
Called [facility] scheduling to schedule appt. Scheduler states that for a breast MRI it has to be bilateral. Can you change the order so that I can schedule appt? Thanks.

## 2018-01-29 NOTE — Telephone Encounter (Signed)
-----   Message from Scot Jun, [place] sent at 01/29/2018 10:41 AM EDT ----- Schedule MRI Thursday preferably at [facility] and or [facility] med center

## 2018-01-29 NOTE — Telephone Encounter (Signed)
Correct ordered placed.

## 2018-01-30 DIAGNOSIS — D249 Benign neoplasm of unspecified breast: Secondary | ICD-10-CM | POA: Insufficient documentation

## 2018-01-30 DIAGNOSIS — R928 Other abnormal and inconclusive findings on diagnostic imaging of breast: Secondary | ICD-10-CM | POA: Insufficient documentation

## 2018-02-06 ENCOUNTER — Telehealth: Payer: Self-pay | Admitting: Family Medicine

## 2018-02-06 ENCOUNTER — Ambulatory Visit (HOSPITAL_COMMUNITY)
Admission: RE | Admit: 2018-02-06 | Discharge: 2018-02-06 | Disposition: A | Discharge status: Home / Self Care | Payer: BLUE CROSS/BLUE SHIELD | Source: Ambulatory Visit | Attending: Family Medicine | Admitting: Family Medicine

## 2018-02-06 DIAGNOSIS — R928 Other abnormal and inconclusive findings on diagnostic imaging of breast: Secondary | ICD-10-CM

## 2018-02-06 DIAGNOSIS — N6452 Nipple discharge: Secondary | ICD-10-CM | POA: Diagnosis not present

## 2018-02-06 MED ORDER — GADOBUTROL 1 MMOL/ML IV SOLN
8.0000 mL | Freq: Once | INTRAVENOUS | Status: AC | PRN
  Administered 2018-02-06: 8 mL via INTRAVENOUS

## 2018-02-06 NOTE — Telephone Encounter (Signed)
Attempted to reach patient to discuss MRI results. Left voicemail to call office

## 2018-02-06 NOTE — Telephone Encounter (Signed)
Received call from breast center regarding abnormal MRI. Patient will need a MRI RT breast guided breat with biospy

## 2018-02-07 ENCOUNTER — Telehealth: Payer: Self-pay | Admitting: Family Medicine

## 2018-02-07 NOTE — Telephone Encounter (Signed)
Called the Breast Center to notify them that patient was aware of results.

## 2018-02-07 NOTE — Telephone Encounter (Signed)
Patient called requesting results, please follow up with patient.

## 2018-02-07 NOTE — Telephone Encounter (Signed)
Spoke with patient and advised of MRI results and that an order has been placed for MRI with guided biopsy. Advised that the breast center will contact her with schedule for procedure.    Molli Barrows, FNP Primary Care at [facility] [address] [place] [phone]: [phone]

## 2018-02-07 NOTE — Telephone Encounter (Signed)
Call transferred to provider to discuss labs.

## 2018-02-10 ENCOUNTER — Other Ambulatory Visit: Payer: Self-pay | Admitting: Family Medicine

## 2018-02-10 DIAGNOSIS — R928 Other abnormal and inconclusive findings on diagnostic imaging of breast: Secondary | ICD-10-CM

## 2018-02-19 ENCOUNTER — Telehealth: Payer: Self-pay

## 2018-02-19 NOTE — Telephone Encounter (Signed)
Patient stopped by the office & dropped off FMLA paperwork to be completed.

## 2018-02-24 ENCOUNTER — Ambulatory Visit
Admission: RE | Admit: 2018-02-24 | Discharge: 2018-02-24 | Disposition: A | Discharge status: Home / Self Care | Payer: BLUE CROSS/BLUE SHIELD | Source: Ambulatory Visit | Attending: Family Medicine | Admitting: Family Medicine

## 2018-02-24 ENCOUNTER — Other Ambulatory Visit: Payer: Self-pay | Admitting: Family Medicine

## 2018-02-24 ENCOUNTER — Ambulatory Visit: Payer: BLUE CROSS/BLUE SHIELD

## 2018-02-24 DIAGNOSIS — R928 Other abnormal and inconclusive findings on diagnostic imaging of breast: Secondary | ICD-10-CM

## 2018-02-24 DIAGNOSIS — R9389 Abnormal findings on diagnostic imaging of other specified body structures: Secondary | ICD-10-CM

## 2018-02-24 MED ORDER — GADOBUTROL 1 MMOL/ML IV SOLN
8.0000 mL | Freq: Once | INTRAVENOUS | Status: AC | PRN
  Administered 2018-02-24: 8 mL via INTRAVENOUS

## 2018-02-24 NOTE — Telephone Encounter (Signed)
Patient called requesting status of FMLA paperwork, patient was notified it would take a week. Patient understood. However patient would like a call back from the nurse for a different matter.

## 2018-02-25 NOTE — Telephone Encounter (Signed)
FMLA paperwork completed. Patient will need to sign page 1, then fax to employer and scan copy to EMR, return original to patient.  Molli Barrows, FNP

## 2018-02-25 NOTE — Progress Notes (Signed)
Contact the patient to advise her to contact the breast center to ensure they've scheduled her for repeat biopsy in 1-2 weeks.  Her menstrual cycle may have caused enhancement of the region of the breast needed to view with MRI.  FMLA paperwork is completed

## 2018-02-26 ENCOUNTER — Encounter: Payer: BLUE CROSS/BLUE SHIELD | Admitting: *Deleted

## 2018-02-26 DIAGNOSIS — Z131 Encounter for screening for diabetes mellitus: Secondary | ICD-10-CM

## 2018-02-26 DIAGNOSIS — Z1329 Encounter for screening for other suspected endocrine disorder: Secondary | ICD-10-CM

## 2018-02-26 DIAGNOSIS — Z13220 Encounter for screening for lipoid disorders: Secondary | ICD-10-CM

## 2018-02-26 DIAGNOSIS — D649 Anemia, unspecified: Secondary | ICD-10-CM

## 2018-02-26 NOTE — Progress Notes (Signed)
Unsuccessful draw with no complications. Please  This encounter was created in error - please disregard.

## 2018-02-27 NOTE — Progress Notes (Signed)
Discussed with patient at her lab appt.

## 2018-03-10 ENCOUNTER — Ambulatory Visit
Admission: RE | Admit: 2018-03-10 | Discharge: 2018-03-10 | Disposition: A | Discharge status: Home / Self Care | Payer: BLUE CROSS/BLUE SHIELD | Source: Ambulatory Visit | Attending: Family Medicine | Admitting: Family Medicine

## 2018-03-10 ENCOUNTER — Ambulatory Visit: Admission: RE | Admit: 2018-03-10 | Payer: BLUE CROSS/BLUE SHIELD | Source: Ambulatory Visit

## 2018-03-10 DIAGNOSIS — R928 Other abnormal and inconclusive findings on diagnostic imaging of breast: Secondary | ICD-10-CM | POA: Diagnosis not present

## 2018-03-10 DIAGNOSIS — R9389 Abnormal findings on diagnostic imaging of other specified body structures: Secondary | ICD-10-CM

## 2018-03-10 DIAGNOSIS — D241 Benign neoplasm of right breast: Secondary | ICD-10-CM | POA: Diagnosis not present

## 2018-03-10 MED ORDER — GADOBUTROL 1 MMOL/ML IV SOLN
8.0000 mL | Freq: Once | INTRAVENOUS | Status: AC | PRN
  Administered 2018-03-10: 8 mL via INTRAVENOUS

## 2018-03-27 ENCOUNTER — Ambulatory Visit: Payer: Self-pay | Admitting: General Surgery

## 2018-03-27 DIAGNOSIS — N6011 Diffuse cystic mastopathy of right breast: Secondary | ICD-10-CM

## 2018-03-27 DIAGNOSIS — D241 Benign neoplasm of right breast: Secondary | ICD-10-CM | POA: Diagnosis not present

## 2018-03-28 ENCOUNTER — Ambulatory Visit: Payer: Self-pay | Admitting: General Surgery

## 2018-04-28 ENCOUNTER — Other Ambulatory Visit: Payer: Self-pay | Admitting: General Surgery

## 2018-04-28 DIAGNOSIS — N6011 Diffuse cystic mastopathy of right breast: Secondary | ICD-10-CM

## 2018-05-15 ENCOUNTER — Other Ambulatory Visit: Payer: Self-pay

## 2018-05-15 ENCOUNTER — Encounter (HOSPITAL_BASED_OUTPATIENT_CLINIC_OR_DEPARTMENT_OTHER): Payer: Self-pay | Admitting: *Deleted

## 2018-05-19 ENCOUNTER — Inpatient Hospital Stay: Admission: RE | Admit: 2018-05-19 | Payer: Self-pay | Source: Ambulatory Visit

## 2018-05-20 NOTE — Anesthesia Preprocedure Evaluation (Addendum)
Anesthesia Evaluation  Patient identified by MRN, date of birth, ID band Patient awake    Reviewed: Allergy & Precautions, NPO status , Patient's Chart, lab work & pertinent test results  Airway Mallampati: II  TM Distance: >3 FB Neck ROM: Full    Dental  (+) Teeth Intact, Dental Advisory Given, Poor Dentition   Pulmonary    breath sounds clear to auscultation       Cardiovascular  Rhythm:Regular Rate:Normal     Neuro/Psych    GI/Hepatic   Endo/Other    Renal/GU      Musculoskeletal   Abdominal   Peds  Hematology   Anesthesia Other Findings   Reproductive/Obstetrics                            Anesthesia Physical  Anesthesia Plan  ASA: II  Anesthesia Plan: General   Post-op Pain Management:    Induction: Intravenous  PONV Risk Score and Plan: 3 and Ondansetron, Dexamethasone, Treatment may vary due to age or medical condition and Midazolam  Airway Management Planned: LMA and Oral ETT  Additional Equipment:   Intra-op Plan:   Post-operative Plan: Extubation in OR  Informed Consent: I have reviewed the patients History and Physical, chart, labs and discussed the procedure including the risks, benefits and alternatives for the proposed anesthesia with the patient or authorized representative who has indicated his/her understanding and acceptance.     Dental advisory given  Plan Discussed with: CRNA, Anesthesiologist and Surgeon  Anesthesia Plan Comments: ( )        Anesthesia Quick Evaluation

## 2018-05-20 NOTE — H&P (Signed)
History of Present Illness  : Patient known to me from excision of benign papilloma of the right breast causing nipple discharge with surgery in 2015. She was doing well until about a month or 6 weeks ago when she developed spontaneous clear right unilateral nipple discharge. This persisted for several days and she presented to the breast center. She initially underwent bilateral mammogram showing subareolar duct ectasia and a circumscribed subcentimeter nodule in the upper right breast. Ultrasound was performed showing subareolar duct ectasia most prominent at 6:00. No intraductal masses seen. The small nodule was a benign-appearing cyst. With this finding bilateral breast MRI was recommended and performed. This revealed a linear area of non-mass enhancement in the right breast spanning 3.8 cm from anterior to posterior which was felt suspicious. Borderline right axillary lymph node was noted. MRI guided biopsy was recommended. Initial attempt at this was canceled due to increased enhancement likely related to her menstrual period obscuring the mass. She subsequently returned and on 03/11/2018 underwent MRI did core needle biopsy of the right breast both of the anterior and more posterior extent of the linear enhancement in the central right breast using a lateral approach. A cylinder tissue clip was deployed into the anterior aspect. A barbell-shaped clip was deployed at the biopsy site of the posterior aspect. Both clips on postbiopsy film were noted to migrate medially, the posterior barbell clip having migrated 1.5 cm and the anterior cylinder clip having migrated 2 cm medially. 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 She has not had mass or skin changes. Her nipple discharge has stopped for the last several weeks. Only family history of breast cancer is a paternal aunt.   Problem List/Past Medical  PAPILLOMA OF BREAST, RIGHT (D24.1)   Past Surgical History Breast  Biopsy  Right.  Diagnostic Studies History  Colonoscopy  never Mammogram  within last year Pap Smear  1-5 years ago  Allergies  No Known Drug Allergies [07/28/2014]:  Medication History Medications Reconciled  Social History  Alcohol use  Occasional alcohol use, Remotely quit alcohol use. Caffeine use  Carbonated beverages, Tea. No drug use  Tobacco use  Never smoker.  Family History  Hypertension  Mother. Prostate Cancer  Father. Migraine Headache  Brother. Thyroid problems  Mother, Sister.  Pregnancy / Birth History  Age at menarche  15 years, 78 years. Gravida  4 Irregular periods  Maternal age  75-20 Para  4 Regular periods   Other Problems  No pertinent past medical history     Review of Systems  General Not Present- Appetite Loss, Chills, Fatigue, Fever, Night Sweats, Weight Gain and Weight Loss. Skin Not Present- Change in Wart/Mole, Dryness, Hives, Jaundice, New Lesions, Non-Healing Wounds, Rash and Ulcer. HEENT Not Present- Earache, Hearing Loss, Hoarseness, Nose Bleed, Oral Ulcers, Ringing in the Ears, Seasonal Allergies, Sinus Pain, Sore Throat, Visual Disturbances, Wears glasses/contact lenses and Yellow Eyes. Respiratory Present- Snoring. Not Present- Bloody sputum, Chronic Cough, Difficulty Breathing and Wheezing. Breast Present- Nipple Discharge. Not Present- Breast Mass, Breast Pain and Skin Changes. Cardiovascular Not Present- Chest Pain, Difficulty Breathing Lying Down, Leg Cramps, Palpitations, Rapid Heart Rate, Shortness of Breath and Swelling of Extremities. Gastrointestinal Not Present- Abdominal Pain, Bloating, Bloody Stool, Change in Bowel Habits, Chronic diarrhea, Constipation, Difficulty Swallowing, Excessive gas, Gets full quickly at meals, Hemorrhoids, Indigestion, Nausea, Rectal Pain and Vomiting. Female Genitourinary Not Present- Frequency, Nocturia, Painful Urination, Pelvic Pain and Urgency. Musculoskeletal Not  Present- Back Pain, Joint Pain, Joint Stiffness,  Muscle Pain, Muscle Weakness and Swelling of Extremities. Neurological Not Present- Decreased Memory, Fainting, Headaches, Numbness, Seizures, Tingling, Tremor, Trouble walking and Weakness. Psychiatric Not Present- Anxiety, Bipolar, Change in Sleep Pattern, Depression, Fearful and Frequent crying. Endocrine Not Present- Cold Intolerance, Excessive Hunger, Hair Changes, Heat Intolerance, Hot flashes and New Diabetes. Hematology Not Present- Blood Thinners, Easy Bruising, Excessive bleeding, Gland problems, HIV and Persistent Infections.  Vitals  Weight: 182 lb Height: 67in Body Surface Area: 1.94 m Body Mass Index: 28.5 kg/m  Pulse: 52 (Regular)  BP: 104/62 (Sitting, Left Arm, Standard)       Physical Exam  The physical exam findings are as follows: Note:General: Alert, well-developed and well nourished African-American female, in no distress Skin: Warm and dry without rash or infection. HEENT: No palpable masses or thyromegaly. Sclera nonicteric. Lymph nodes: No cervical, supraclavicular, nodes palpable. Breasts: Healed lateral circumareolar incision right. No palpable masses. No nipple crusting or inversion. I cannot elicit any discharge. No palpable axillary adenopathy. Lungs: Breath sounds clear and equal. No wheezing or increased work of breathing. Cardiovascular: Regular rate and rhythm without murmer. No JVD or edema. Extremities: No edema or joint swelling or deformity. No chronic venous stasis changes. Neurologic: Alert and fully oriented. Gait normal. No focal weakness. Psychiatric: Normal mood and affect. Thought content appropriate with normal judgement and insight    Assessment & Plan ( PAPILLOMA OF BREAST, RIGHT (D24.1) Impression: History previous right breast papilloma. Now with spontaneous nipple discharge and MRI findings of 3.8 cm of abnormal enhancement, duct ectasia on ultrasound and mammogram, biopsy  of the anterior and posterior portion of this showing papilloma. We discussed these findings and of course she is familiar with the diagnosis. I think there is some risk of underlying malignancy and I would recommend excision. In terms of localization the biopsy clips have migrated. This has been discussed with breast radiology.  The area of enhancement is directly posterior to the nipple and appears could be very adequately excised by going straight posteriorly without any localization particularly in light of the biopsy clip migration. We discussed the nature of the surgery which she of course understands from her previous surgery as well as risks of bleeding or infection, anesthetic complications and possible findings which could lead to additional surgery or treatment.  Current Plans Central duct excision right breast under general anesthesia as an outpatient

## 2018-05-20 NOTE — Progress Notes (Signed)
Pt's daughter in to pick up Ensure Pre op drink. Instructions reviewed.

## 2018-05-21 ENCOUNTER — Ambulatory Visit
Admission: RE | Admit: 2018-05-21 | Discharge: 2018-05-21 | Disposition: A | Discharge status: Home / Self Care | Payer: BLUE CROSS/BLUE SHIELD | Source: Ambulatory Visit | Attending: General Surgery | Admitting: General Surgery

## 2018-05-21 ENCOUNTER — Ambulatory Visit (HOSPITAL_BASED_OUTPATIENT_CLINIC_OR_DEPARTMENT_OTHER): Payer: BLUE CROSS/BLUE SHIELD | Admitting: Anesthesiology

## 2018-05-21 ENCOUNTER — Ambulatory Visit (HOSPITAL_BASED_OUTPATIENT_CLINIC_OR_DEPARTMENT_OTHER)
Admission: RE | Admit: 2018-05-21 | Discharge: 2018-05-21 | Disposition: A | Discharge status: Home / Self Care | Payer: BLUE CROSS/BLUE SHIELD | Attending: General Surgery | Admitting: General Surgery

## 2018-05-21 ENCOUNTER — Encounter (HOSPITAL_BASED_OUTPATIENT_CLINIC_OR_DEPARTMENT_OTHER)
Admission: RE | Disposition: A | Discharge status: Home / Self Care | Payer: Self-pay | Source: Home / Self Care | Attending: General Surgery

## 2018-05-21 ENCOUNTER — Other Ambulatory Visit: Payer: Self-pay

## 2018-05-21 ENCOUNTER — Encounter (HOSPITAL_BASED_OUTPATIENT_CLINIC_OR_DEPARTMENT_OTHER): Payer: Self-pay | Admitting: *Deleted

## 2018-05-21 DIAGNOSIS — N6011 Diffuse cystic mastopathy of right breast: Secondary | ICD-10-CM

## 2018-05-21 DIAGNOSIS — Z803 Family history of malignant neoplasm of breast: Secondary | ICD-10-CM | POA: Diagnosis not present

## 2018-05-21 DIAGNOSIS — D241 Benign neoplasm of right breast: Secondary | ICD-10-CM | POA: Diagnosis not present

## 2018-05-21 HISTORY — PX: BREAST LUMPECTOMY: SHX2

## 2018-05-21 HISTORY — DX: Other specified postprocedural states: R11.2

## 2018-05-21 HISTORY — DX: Other specified postprocedural states: Z98.890

## 2018-05-21 SURGERY — BREAST LUMPECTOMY
Anesthesia: General | Site: Breast | Laterality: Right

## 2018-05-21 MED ORDER — FENTANYL CITRATE (PF) 100 MCG/2ML IJ SOLN: 25.0000 ug | INTRAMUSCULAR | Status: DC | PRN

## 2018-05-21 MED ORDER — ACETAMINOPHEN 160 MG/5ML PO SOLN: 325.0000 mg | ORAL | Status: DC | PRN

## 2018-05-21 MED ORDER — MIDAZOLAM HCL 2 MG/2ML IJ SOLN
1.0000 mg | INTRAMUSCULAR | Status: DC | PRN
  Administered 2018-05-21: 2 mg via INTRAVENOUS

## 2018-05-21 MED ORDER — MEPERIDINE HCL 25 MG/ML IJ SOLN: 6.2500 mg | INTRAMUSCULAR | Status: DC | PRN

## 2018-05-21 MED ORDER — CHLORHEXIDINE GLUCONATE CLOTH 2 % EX PADS: 6.0000 | MEDICATED_PAD | Freq: Once | CUTANEOUS | Status: DC

## 2018-05-21 MED ORDER — FENTANYL CITRATE (PF) 100 MCG/2ML IJ SOLN
INTRAMUSCULAR | Status: AC
  Filled 2018-05-21: qty 2

## 2018-05-21 MED ORDER — MIDAZOLAM HCL 2 MG/2ML IJ SOLN
INTRAMUSCULAR | Status: AC
  Filled 2018-05-21: qty 2

## 2018-05-21 MED ORDER — OXYCODONE HCL 5 MG PO TABS: 5.0000 mg | ORAL_TABLET | Freq: Once | ORAL | Status: DC | PRN

## 2018-05-21 MED ORDER — SCOPOLAMINE 1 MG/3DAYS TD PT72
MEDICATED_PATCH | TRANSDERMAL | Status: AC
  Filled 2018-05-21: qty 1

## 2018-05-21 MED ORDER — ACETAMINOPHEN 500 MG PO TABS
ORAL_TABLET | ORAL | Status: AC
  Filled 2018-05-21: qty 2

## 2018-05-21 MED ORDER — OXYCODONE HCL 5 MG PO TABS: 5.0000 mg | ORAL_TABLET | Freq: Four times a day (QID) | ORAL | 0 refills | Status: DC | PRN

## 2018-05-21 MED ORDER — BUPIVACAINE HCL (PF) 0.25 % IJ SOLN
INTRAMUSCULAR | Status: DC | PRN
  Administered 2018-05-21: 30 mL

## 2018-05-21 MED ORDER — CEFAZOLIN SODIUM-DEXTROSE 2-4 GM/100ML-% IV SOLN
INTRAVENOUS | Status: AC
  Filled 2018-05-21: qty 100

## 2018-05-21 MED ORDER — DEXAMETHASONE SODIUM PHOSPHATE 4 MG/ML IJ SOLN
INTRAMUSCULAR | Status: DC | PRN
  Administered 2018-05-21: 10 mg via INTRAVENOUS

## 2018-05-21 MED ORDER — GABAPENTIN 300 MG PO CAPS
ORAL_CAPSULE | ORAL | Status: AC
  Filled 2018-05-21: qty 1

## 2018-05-21 MED ORDER — LIDOCAINE 2% (20 MG/ML) 5 ML SYRINGE
INTRAMUSCULAR | Status: AC
  Filled 2018-05-21: qty 5

## 2018-05-21 MED ORDER — ONDANSETRON HCL 4 MG/2ML IJ SOLN
INTRAMUSCULAR | Status: DC | PRN
  Administered 2018-05-21: 4 mg via INTRAVENOUS

## 2018-05-21 MED ORDER — ONDANSETRON HCL 4 MG/2ML IJ SOLN
4.0000 mg | Freq: Once | INTRAMUSCULAR | Status: AC | PRN
  Administered 2018-05-21: 4 mg via INTRAVENOUS

## 2018-05-21 MED ORDER — LACTATED RINGERS IV SOLN
INTRAVENOUS | Status: DC
  Administered 2018-05-21: 08:00:00 via INTRAVENOUS

## 2018-05-21 MED ORDER — DEXAMETHASONE SODIUM PHOSPHATE 10 MG/ML IJ SOLN
INTRAMUSCULAR | Status: AC
  Filled 2018-05-21: qty 1

## 2018-05-21 MED ORDER — ACETAMINOPHEN 325 MG PO TABS: 325.0000 mg | ORAL_TABLET | ORAL | Status: DC | PRN

## 2018-05-21 MED ORDER — CEFAZOLIN SODIUM-DEXTROSE 2-4 GM/100ML-% IV SOLN
2.0000 g | INTRAVENOUS | Status: AC
  Administered 2018-05-21: 2 g via INTRAVENOUS

## 2018-05-21 MED ORDER — ONDANSETRON HCL 4 MG/2ML IJ SOLN
INTRAMUSCULAR | Status: AC
  Filled 2018-05-21: qty 2

## 2018-05-21 MED ORDER — CELECOXIB 200 MG PO CAPS
ORAL_CAPSULE | ORAL | Status: AC
  Filled 2018-05-21: qty 1

## 2018-05-21 MED ORDER — SCOPOLAMINE 1 MG/3DAYS TD PT72
1.0000 | MEDICATED_PATCH | Freq: Once | TRANSDERMAL | Status: DC | PRN
  Administered 2018-05-21: 1.5 mg via TRANSDERMAL

## 2018-05-21 MED ORDER — FENTANYL CITRATE (PF) 100 MCG/2ML IJ SOLN
50.0000 ug | INTRAMUSCULAR | Status: AC | PRN
  Administered 2018-05-21: 50 ug via INTRAVENOUS
  Administered 2018-05-21 (×2): 25 ug via INTRAVENOUS

## 2018-05-21 MED ORDER — LIDOCAINE 2% (20 MG/ML) 5 ML SYRINGE
INTRAMUSCULAR | Status: DC | PRN
  Administered 2018-05-21: 60 mg via INTRAVENOUS

## 2018-05-21 MED ORDER — CELECOXIB 200 MG PO CAPS
200.0000 mg | ORAL_CAPSULE | ORAL | Status: AC
  Administered 2018-05-21: 200 mg via ORAL

## 2018-05-21 MED ORDER — PROPOFOL 10 MG/ML IV BOLUS
INTRAVENOUS | Status: DC | PRN
  Administered 2018-05-21: 140 mg via INTRAVENOUS

## 2018-05-21 MED ORDER — ACETAMINOPHEN 500 MG PO TABS
1000.0000 mg | ORAL_TABLET | ORAL | Status: AC
  Administered 2018-05-21: 1000 mg via ORAL

## 2018-05-21 MED ORDER — OXYCODONE HCL 5 MG/5ML PO SOLN: 5.0000 mg | Freq: Once | ORAL | Status: DC | PRN

## 2018-05-21 MED ORDER — GABAPENTIN 300 MG PO CAPS
300.0000 mg | ORAL_CAPSULE | ORAL | Status: AC
  Administered 2018-05-21: 300 mg via ORAL

## 2018-05-21 SURGICAL SUPPLY — 48 items
BINDER BREAST LRG (GAUZE/BANDAGES/DRESSINGS) IMPLANT
BINDER BREAST MEDIUM (GAUZE/BANDAGES/DRESSINGS) IMPLANT
BINDER BREAST XLRG (GAUZE/BANDAGES/DRESSINGS) IMPLANT
BINDER BREAST XXLRG (GAUZE/BANDAGES/DRESSINGS) IMPLANT
BLADE SURG 15 STRL LF DISP TIS (BLADE) ×1 IMPLANT
BLADE SURG 15 STRL SS (BLADE) ×1
CANISTER SUC SOCK COL 7IN (MISCELLANEOUS) IMPLANT
CANISTER SUCT 1200ML W/VALVE (MISCELLANEOUS) IMPLANT
CHLORAPREP W/TINT 26ML (MISCELLANEOUS) ×2 IMPLANT
CLIP VESOCCLUDE SM WIDE 6/CT (CLIP) ×2 IMPLANT
COVER BACK TABLE 60X90IN (DRAPES) ×2 IMPLANT
COVER MAYO STAND STRL (DRAPES) ×2 IMPLANT
COVER PROBE W GEL 5X96 (DRAPES) ×2 IMPLANT
COVER WAND RF STERILE (DRAPES) IMPLANT
DECANTER SPIKE VIAL GLASS SM (MISCELLANEOUS) IMPLANT
DERMABOND ADVANCED (GAUZE/BANDAGES/DRESSINGS) ×2
DERMABOND ADVANCED .7 DNX12 (GAUZE/BANDAGES/DRESSINGS) ×2 IMPLANT
DRAPE HALF SHEET 40X57 (DRAPES) ×2 IMPLANT
DRAPE LAPAROSCOPIC ABDOMINAL (DRAPES) ×2 IMPLANT
DRAPE UTILITY XL STRL (DRAPES) ×2 IMPLANT
ELECT COATED BLADE 2.86 ST (ELECTRODE) ×2 IMPLANT
ELECT REM PT RETURN 9FT ADLT (ELECTROSURGICAL) ×2
ELECTRODE REM PT RTRN 9FT ADLT (ELECTROSURGICAL) ×1 IMPLANT
GLOVE BIOGEL PI IND STRL 8 (GLOVE) ×3 IMPLANT
GLOVE BIOGEL PI INDICATOR 8 (GLOVE) ×3
GLOVE ECLIPSE 7.5 STRL STRAW (GLOVE) ×2 IMPLANT
GLOVE SURG SYN 8.0 (GLOVE) ×2 IMPLANT
GOWN STRL REIN XL XLG (GOWN DISPOSABLE) ×2 IMPLANT
GOWN STRL REUS W/ TWL LRG LVL3 (GOWN DISPOSABLE) IMPLANT
GOWN STRL REUS W/ TWL XL LVL3 (GOWN DISPOSABLE) ×1 IMPLANT
GOWN STRL REUS W/TWL LRG LVL3 (GOWN DISPOSABLE)
GOWN STRL REUS W/TWL XL LVL3 (GOWN DISPOSABLE) ×1
ILLUMINATOR WAVEGUIDE N/F (MISCELLANEOUS) IMPLANT
KIT MARKER MARGIN INK (KITS) ×2 IMPLANT
LIGHT WAVEGUIDE WIDE FLAT (MISCELLANEOUS) IMPLANT
NEEDLE HYPO 25X1 1.5 SAFETY (NEEDLE) ×2 IMPLANT
NS IRRIG 1000ML POUR BTL (IV SOLUTION) IMPLANT
PACK BASIN DAY SURGERY FS (CUSTOM PROCEDURE TRAY) ×2 IMPLANT
PENCIL BUTTON HOLSTER BLD 10FT (ELECTRODE) ×2 IMPLANT
SLEEVE SCD COMPRESS KNEE MED (MISCELLANEOUS) ×2 IMPLANT
SPONGE LAP 4X18 RFD (DISPOSABLE) ×2 IMPLANT
SUT MON AB 5-0 PS2 18 (SUTURE) ×2 IMPLANT
SUT VICRYL 3-0 CR8 SH (SUTURE) ×2 IMPLANT
SYR CONTROL 10ML LL (SYRINGE) ×2 IMPLANT
TOWEL GREEN STERILE FF (TOWEL DISPOSABLE) ×2 IMPLANT
TRAY FAXITRON CT DISP (TRAY / TRAY PROCEDURE) IMPLANT
TUBE CONNECTING 20X1/4 (TUBING) IMPLANT
YANKAUER SUCT BULB TIP NO VENT (SUCTIONS) IMPLANT

## 2018-05-21 NOTE — Interval H&P Note (Signed)
History and Physical Interval Note:  05/21/2018 8:23 AM  Sharon Meyer  has presented today for surgery, with the diagnosis of RIGHT BREAST PAPILLOMA  The various methods of treatment have been discussed with the patient and family. After consideration of risks, benefits and other options for treatment, the patient has consented to  Procedure(s): RIGHT BREAST LUMPECTOMY (Right) as a surgical intervention .  The patient's history has been reviewed, patient examined, no change in status, stable for surgery.  I have reviewed the patient's chart and labs.  Questions were answered to the patient's satisfaction.     Darene Lamer Laszlo Ellerby

## 2018-05-21 NOTE — Discharge Instructions (Signed)
[facility] °Office Phone Number [phone] ° °BREAST BIOPSY/ PARTIAL MASTECTOMY: POST OP INSTRUCTIONS ° °Always review your discharge instruction sheet given to you by the facility where your surgery was performed. ° °IF YOU HAVE DISABILITY OR FAMILY LEAVE FORMS, YOU MUST BRING THEM TO THE OFFICE FOR PROCESSING.  DO NOT GIVE THEM TO YOUR DOCTOR. ° °1. A prescription for pain medication may be given to you upon discharge.  Take your pain medication as prescribed, if needed.  If narcotic pain medicine is not needed, then you may take acetaminophen (Tylenol) or ibuprofen (Advil) as needed. °2. Take your usually prescribed medications unless otherwise directed °3. If you need a refill on your pain medication, please contact your pharmacy.  They will contact our office to request authorization.  Prescriptions will not be filled after 5pm or on week-ends. °4. You should eat very light the first 24 hours after surgery, such as soup, crackers, pudding, etc.  Resume your normal diet the day after surgery. °5. Most patients will experience some swelling and bruising in the breast.  Ice packs and a good support bra will help.  Swelling and bruising can take several days to resolve.  °6. It is common to experience some constipation if taking pain medication after surgery.  Increasing fluid intake and taking a stool softener will usually help or prevent this problem from occurring.  A mild laxative (Milk of Magnesia or Miralax) should be taken according to package directions if there are no bowel movements after 48 hours. °7. Unless discharge instructions indicate otherwise, you may remove your bandages 24-48 hours after surgery, and you may shower at that time.  You may have steri-strips (small skin tapes) in place directly over the incision.  These strips should be left on the skin for 7-10 days.  If your surgeon used skin glue on the incision, you may shower in 24 hours.  The glue will flake off over the  next 2-3 weeks.  Any sutures or staples will be removed at the office during your follow-up visit. °8. ACTIVITIES:  You may resume regular daily activities (gradually increasing) beginning the next day.  Wearing a good support bra or sports bra minimizes pain and swelling.  You may have sexual intercourse when it is comfortable. °a. You may drive when you no longer are taking prescription pain medication, you can comfortably wear a seatbelt, and you can safely maneuver your car and apply brakes. °b. RETURN TO WORK:  ______________________________________________________________________________________ °9. You should see your doctor in the office for a follow-up appointment approximately two weeks after your surgery.  Your doctor’s nurse will typically make your follow-up appointment when she calls you with your pathology report.  Expect your pathology report 2-3 business days after your surgery.  You may call to check if you do not hear from us after three days. °10. OTHER INSTRUCTIONS: _______________________________________________________________________________________________ _____________________________________________________________________________________________________________________________________ °_____________________________________________________________________________________________________________________________________ °_____________________________________________________________________________________________________________________________________ ° °WHEN TO CALL YOUR DOCTOR: °1. Fever over 101.0 °2. Nausea and/or vomiting. °3. Extreme swelling or bruising. °4. Continued bleeding from incision. °5. Increased pain, redness, or drainage from the incision. ° °The clinic staff is available to answer your questions during regular business hours.  Please don’t hesitate to call and ask to speak to one of the nurses for clinical concerns.  If you have a medical emergency, go to the nearest  emergency room or call 911.  A surgeon from [facility] is always on call at the hospital. ° °For further questions, please visit [email or web address]  ° ° ° ° °  Post Anesthesia Home Care Instructions ° °Activity: °Get plenty of rest for the remainder of the day. A responsible individual must stay with you for 24 hours following the procedure.  °For the next 24 hours, DO NOT: °-Drive a car °-Operate machinery °-Drink alcoholic beverages °-Take any medication unless instructed by your physician °-Make any legal decisions or sign important papers. ° °Meals: °Start with liquid foods such as gelatin or soup. Progress to regular foods as tolerated. Avoid greasy, spicy, heavy foods. If nausea and/or vomiting occur, drink only clear liquids until the nausea and/or vomiting subsides. Call your physician if vomiting continues. ° °Special Instructions/Symptoms: °Your throat may feel dry or sore from the anesthesia or the breathing tube placed in your throat during surgery. If this causes discomfort, gargle with warm salt water. The discomfort should disappear within 24 hours. ° °If you had a scopolamine patch placed behind your ear for the management of post- operative nausea and/or vomiting: ° °1. The medication in the patch is effective for 72 hours, after which it should be removed.  Wrap patch in a tissue and discard in the trash. Wash hands thoroughly with soap and water. °2. You may remove the patch earlier than 72 hours if you experience unpleasant side effects which may include dry mouth, dizziness or visual disturbances. °3. Avoid touching the patch. Wash your hands with soap and water after contact with the patch. °  ° °

## 2018-05-21 NOTE — Anesthesia Procedure Notes (Signed)
Procedure Name: LMA Insertion Date/Time: 05/21/2018 8:34 AM Performed by: Lieutenant Diego, CRNA Pre-anesthesia Checklist: Patient identified, Emergency Drugs available, Suction available and Patient being monitored Patient Re-evaluated:Patient Re-evaluated prior to induction Oxygen Delivery Method: Circle system utilized Preoxygenation: Pre-oxygenation with 100% oxygen Induction Type: IV induction Ventilation: Mask ventilation without difficulty LMA: LMA inserted LMA Size: 4.0 Number of attempts: 1 Placement Confirmation: positive ETCO2 and breath sounds checked- equal and bilateral Tube secured with: Tape Dental Injury: Teeth and Oropharynx as per pre-operative assessment

## 2018-05-21 NOTE — Anesthesia Postprocedure Evaluation (Signed)
Anesthesia Post Note  Patient: Sharon Meyer  Procedure(s) Performed: RIGHT BREAST LUMPECTOMY (Right Breast)     Anesthesia Post Evaluation  Last Vitals:  Vitals:   05/21/18 0922 05/21/18 0923  BP:  109/72  Pulse: 94 85  Resp: (!) 22 17  Temp:  (P) 36.5 C  SpO2: 100% 100%    Last Pain:  Vitals:   05/21/18 0714  TempSrc: Oral  PainSc: 0-No pain                 Renia Mikelson

## 2018-05-21 NOTE — Addendum Note (Signed)
Addendum  created 05/21/18 0933 by Janeece Riggers, MD   Order list changed, Order sets accessed

## 2018-05-21 NOTE — Transfer of Care (Signed)
Immediate Anesthesia Transfer of Care Note  Patient: Sharon Meyer  Procedure(s) Performed: RIGHT BREAST LUMPECTOMY (Right Breast)  Patient Location: PACU  Anesthesia Type:General  Level of Consciousness: awake  Airway & Oxygen Therapy: Patient Spontanous Breathing and Patient connected to face mask oxygen  Post-op Assessment: Report given to RN and Post -op Vital signs reviewed and stable  Post vital signs: Reviewed and stable  Last Vitals:  Vitals Value Taken Time  BP    Temp    Pulse 94 05/21/2018  9:22 AM  Resp 22 05/21/2018  9:22 AM  SpO2 100 % 05/21/2018  9:22 AM  Vitals shown include unvalidated device data.  Last Pain:  Vitals:   05/21/18 0714  TempSrc: Oral  PainSc: 0-No pain         Complications: No apparent anesthesia complications

## 2018-05-21 NOTE — Op Note (Signed)
Preoperative Diagnosis: RIGHT BREAST PAPILLOMA  Postoprative Diagnosis: RIGHT BREAST PAPILLOMA  Procedure: Procedure(s): Central duct excision right breast   Surgeon: Excell Seltzer T   Assistants: None  Anesthesia:  General LMA anesthesia  Indications: Patient with previous history of right breast papilloma excision 2015.  Now presents with clear right nipple discharge and MRI revealing non-mass enhancement extending 3.8 cm directly posterior to the nipple.  MRI biopsy x2 of the anterior and posterior extent has revealed intraductal papillomas.  Excision recommended.  The biopsy clips migrated.  The abnormal enhancement in original biopsy was directly posterior to the nipple so after consultation with the radiologist we have elected to proceed with excision of the central breast ducts from the nipple directly posterior for 4 cm.  This been discussed in detail with the patient including indications and risks detailed elsewhere and she agrees.    Procedure Detail: Patient was brought to the operating room, placed in supine position on operating table, and laryngeal mask general anesthesia induced.  The right breast was widely sterilely prepped and draped.  She received preoperative IV antibiotics.  PAS were in place.  Patient timeout was performed and correct procedure verified.  I used the previous lateral circumareolar incision and dissection was carried down into the subcutaneous tissue.  An areolar flap was raised medially toward the nipple.  The ducts were encircled right at the nipple and tied with 3-0 Vicryl.  I sharply dissected the ducts off of the nipple just behind the skin above this tie.  The central breast ducts were then excised in a conical fashion down into the central breast with an ultimate diameter of about 2 cm and excised this back 4 cm into the central breast.  Completely excised the central ducts with surrounding fatty tissue in all directions.  Specimen was inked for  margins.  I did x-ray the specimen which not unexpectedly did not contain the clips which had migrated.  This was sent for permanent pathology.  Soft tissue was extensively infiltrated with Marcaine.  Complete hemostasis was obtained.  The deep breast and subcutaneous tissue was closed with interrupted 3-0 Vicryl and the skin with a running subcuticular 5-0 Monocryl and Dermabond.  Sponge needle and instrument counts were correct.    Findings: As above  Estimated Blood Loss:  Minimal         Drains: None  Blood Given: none          Specimens: Central ducts right breast        Complications:  * No complications entered in OR log *         Disposition: PACU - hemodynamically stable.         Condition: stable

## 2018-05-22 ENCOUNTER — Encounter (HOSPITAL_BASED_OUTPATIENT_CLINIC_OR_DEPARTMENT_OTHER): Payer: Self-pay | Admitting: General Surgery

## 2018-07-10 ENCOUNTER — Emergency Department (HOSPITAL_COMMUNITY): Payer: BLUE CROSS/BLUE SHIELD

## 2018-07-10 ENCOUNTER — Encounter (HOSPITAL_COMMUNITY): Payer: Self-pay

## 2018-07-10 ENCOUNTER — Other Ambulatory Visit: Payer: Self-pay

## 2018-07-10 ENCOUNTER — Observation Stay (HOSPITAL_COMMUNITY)
Admission: EM | Admit: 2018-07-10 | Discharge: 2018-07-11 | Disposition: A | Discharge status: Home / Self Care | Payer: BLUE CROSS/BLUE SHIELD | Attending: Student in an Organized Health Care Education/Training Program | Admitting: Student in an Organized Health Care Education/Training Program

## 2018-07-10 ENCOUNTER — Telehealth: Payer: Self-pay | Admitting: Family Medicine

## 2018-07-10 DIAGNOSIS — D259 Leiomyoma of uterus, unspecified: Secondary | ICD-10-CM | POA: Diagnosis not present

## 2018-07-10 DIAGNOSIS — R079 Chest pain, unspecified: Secondary | ICD-10-CM | POA: Diagnosis not present

## 2018-07-10 DIAGNOSIS — Z853 Personal history of malignant neoplasm of breast: Secondary | ICD-10-CM | POA: Insufficient documentation

## 2018-07-10 DIAGNOSIS — Z872 Personal history of diseases of the skin and subcutaneous tissue: Secondary | ICD-10-CM

## 2018-07-10 DIAGNOSIS — D500 Iron deficiency anemia secondary to blood loss (chronic): Principal | ICD-10-CM | POA: Insufficient documentation

## 2018-07-10 DIAGNOSIS — D619 Aplastic anemia, unspecified: Secondary | ICD-10-CM

## 2018-07-10 DIAGNOSIS — Z9889 Other specified postprocedural states: Secondary | ICD-10-CM | POA: Diagnosis not present

## 2018-07-10 DIAGNOSIS — D649 Anemia, unspecified: Secondary | ICD-10-CM | POA: Diagnosis not present

## 2018-07-10 DIAGNOSIS — D509 Iron deficiency anemia, unspecified: Secondary | ICD-10-CM

## 2018-07-10 DIAGNOSIS — R0789 Other chest pain: Secondary | ICD-10-CM | POA: Diagnosis not present

## 2018-07-10 DIAGNOSIS — Z8742 Personal history of other diseases of the female genital tract: Secondary | ICD-10-CM

## 2018-07-10 DIAGNOSIS — N920 Excessive and frequent menstruation with regular cycle: Secondary | ICD-10-CM | POA: Diagnosis not present

## 2018-07-10 DIAGNOSIS — R0602 Shortness of breath: Secondary | ICD-10-CM | POA: Diagnosis not present

## 2018-07-10 LAB — VITAMIN B12: Vitamin B-12: 1029 pg/mL — ABNORMAL HIGH (ref 180–914)

## 2018-07-10 LAB — RETICULOCYTES
Immature Retic Fract: 15.3 % (ref 2.3–15.9)
RBC.: 3.37 MIL/uL — ABNORMAL LOW (ref 3.87–5.11)
Retic Count, Absolute: 36.7 10*3/uL (ref 19.0–186.0)
Retic Ct Pct: 1.1 % (ref 0.4–3.1)

## 2018-07-10 LAB — I-STAT BETA HCG BLOOD, ED (MC, WL, AP ONLY): I-stat hCG, quantitative: <5 m[IU]/mL (ref <5)

## 2018-07-10 LAB — TROPONIN I
Troponin I: <0.03 ng/mL (ref <0.03)
Troponin I: <0.03 ng/mL (ref <0.03)

## 2018-07-10 LAB — CBC
HCT: 20 % — ABNORMAL LOW (ref 36.0–46.0)
HCT: 23.8 % — ABNORMAL LOW (ref 36.0–46.0)
Hemoglobin: 5 g/dL — CL (ref 12.0–15.0)
Hemoglobin: 6.4 g/dL — CL (ref 12.0–15.0)
MCH: 15.2 pg — ABNORMAL LOW (ref 26.0–34.0)
MCH: 17.3 pg — ABNORMAL LOW (ref 26.0–34.0)
MCHC: 25 g/dL — ABNORMAL LOW (ref 30.0–36.0)
MCHC: 26.9 g/dL — ABNORMAL LOW (ref 30.0–36.0)
MCV: 61 fL — ABNORMAL LOW (ref 80.0–100.0)
MCV: 64.3 fL — ABNORMAL LOW (ref 80.0–100.0)
Platelets: 381 10*3/uL (ref 150–400)
Platelets: 359 10*3/uL (ref 150–400)
RBC: 3.28 MIL/uL — ABNORMAL LOW (ref 3.87–5.11)
RBC: 3.7 MIL/uL — ABNORMAL LOW (ref 3.87–5.11)
RDW: 23.3 % — ABNORMAL HIGH (ref 11.5–15.5)
RDW: 27.4 % — ABNORMAL HIGH (ref 11.5–15.5)
WBC: 7.1 10*3/uL (ref 4.0–10.5)
WBC: 8 10*3/uL (ref 4.0–10.5)
nRBC: 0 % (ref 0.0–0.2)
nRBC: 0 % (ref 0.0–0.2)

## 2018-07-10 LAB — BASIC METABOLIC PANEL
Anion gap: 8 (ref 5–15)
BUN: 7 mg/dL (ref 6–20)
CO2: 19 mmol/L — ABNORMAL LOW (ref 22–32)
Calcium: 8.8 mg/dL — ABNORMAL LOW (ref 8.9–10.3)
Chloride: 108 mmol/L (ref 98–111)
Creatinine, Ser: 0.51 mg/dL (ref 0.44–1.00)
GFR calc Af Amer: >60 mL/min (ref >60)
GFR calc non Af Amer: >60 mL/min (ref >60)
Glucose, Bld: 86 mg/dL (ref 70–99)
Potassium: 3.6 mmol/L (ref 3.5–5.1)
Sodium: 135 mmol/L (ref 135–145)

## 2018-07-10 LAB — ABO/RH: ABO/RH(D): B POS

## 2018-07-10 LAB — PREPARE RBC (CROSSMATCH)

## 2018-07-10 LAB — IRON AND TIBC
Iron: 14 ug/dL — ABNORMAL LOW (ref 28–170)
Saturation Ratios: 3 % — ABNORMAL LOW (ref 10.4–31.8)
TIBC: 504 ug/dL — ABNORMAL HIGH (ref 250–450)
UIBC: 490 ug/dL

## 2018-07-10 LAB — FOLATE: Folate: 22.4 ng/mL (ref >5.9)

## 2018-07-10 LAB — POC OCCULT BLOOD, ED: Fecal Occult Bld: NEGATIVE

## 2018-07-10 LAB — FERRITIN: Ferritin: 3 ng/mL — ABNORMAL LOW (ref 11–307)

## 2018-07-10 LAB — D-DIMER, QUANTITATIVE: D-Dimer, Quant: 0.68 ug/mL-FEU — ABNORMAL HIGH (ref 0.00–0.50)

## 2018-07-10 MED ORDER — ACETAMINOPHEN 650 MG RE SUPP: 650.0000 mg | Freq: Four times a day (QID) | RECTAL | Status: DC | PRN

## 2018-07-10 MED ORDER — NITROGLYCERIN 0.4 MG SL SUBL
0.4000 mg | SUBLINGUAL_TABLET | SUBLINGUAL | Status: DC | PRN
  Filled 2018-07-10: qty 1

## 2018-07-10 MED ORDER — SODIUM CHLORIDE 0.9 % IV SOLN
INTRAVENOUS | Status: AC
  Administered 2018-07-10: 20:00:00 via INTRAVENOUS

## 2018-07-10 MED ORDER — SENNOSIDES-DOCUSATE SODIUM 8.6-50 MG PO TABS: 1.0000 | ORAL_TABLET | Freq: Every evening | ORAL | Status: DC | PRN

## 2018-07-10 MED ORDER — ASPIRIN 81 MG PO CHEW
324.0000 mg | CHEWABLE_TABLET | Freq: Once | ORAL | Status: AC
  Administered 2018-07-10: 324 mg via ORAL
  Filled 2018-07-10: qty 4

## 2018-07-10 MED ORDER — ACETAMINOPHEN 325 MG PO TABS: 650.0000 mg | ORAL_TABLET | Freq: Four times a day (QID) | ORAL | Status: DC | PRN

## 2018-07-10 MED ORDER — IOHEXOL 350 MG/ML SOLN
75.0000 mL | Freq: Once | INTRAVENOUS | Status: AC | PRN
  Administered 2018-07-10: 100 mL via INTRAVENOUS

## 2018-07-10 MED ORDER — SODIUM CHLORIDE 0.9 % IV SOLN: 10.0000 mL/h | Freq: Once | INTRAVENOUS | Status: DC

## 2018-07-10 MED ORDER — SODIUM CHLORIDE 0.9 % IV BOLUS
1000.0000 mL | Freq: Once | INTRAVENOUS | Status: AC
  Administered 2018-07-10: 1000 mL via INTRAVENOUS

## 2018-07-10 NOTE — Telephone Encounter (Signed)
Please review the COVID criteria with patient and symptoms. If she asymptomatic and without fever, there is no indication for her to be sent home from work. Health promotion, good hand washing and social distancing.   Review with patient Coronavirus (COVID-19) Are you at risk?  Are you at risk for the Coronavirus (COVID-19)?  To be considered HIGH RISK for Coronavirus (COVID-19), you have to meet the following criteria:  . Traveled to [place], [place], [place], [place] or [place]; or in the [place] to [place], [place], [place], or [place]; and have fever, cough, and shortness of breath within the last 2 weeks of travel OR . Been in close contact with a person diagnosed with COVID-19 within the last 2 weeks and have fever, cough, and shortness of breath . IF YOU DO NOT MEET THESE CRITERIA, YOU ARE CONSIDERED LOW RISK FOR COVID-19.  What to do if you are HIGH RISK for COVID-19?  Marland Kitchen If you are having a medical emergency, call 911. . Seek medical care right away. Before you go to a doctor's office, urgent care or emergency department, call ahead and tell them about your recent travel, contact with someone diagnosed with COVID-19, and your symptoms. You should receive instructions from your physician's office regarding next steps of care.  . When you arrive at healthcare provider, tell the healthcare staff immediately you have returned from visiting [place], [place], [place], [place] or [place]; or traveled in the [place] to [place], [place], [place], or [place]; in the last two weeks or you have been in close contact with a person diagnosed with COVID-19 in the last 2 weeks.   . Tell the health care staff about your symptoms: fever, cough and shortness of breath. . After you have been seen by a medical provider, you will be either: o Tested for (COVID-19) and discharged home on quarantine except to seek medical care if symptoms worsen, and asked to  - Stay home and avoid  contact with others until you get your results (4-5 days)  - Avoid travel on public transportation if possible (such as bus, train, or airplane) or o Sent to the Emergency Department by EMS for evaluation, COVID-19 testing, and possible admission depending on your condition and test results.  What to do if you are LOW RISK for COVID-19?  Reduce your risk of any infection by using the same precautions used for avoiding the common cold or flu:  Marland Kitchen Wash your hands often with soap and warm water for at least 20 seconds.  If soap and water are not readily available, use an alcohol-based hand sanitizer with at least 60% alcohol.  . If coughing or sneezing, cover your mouth and nose by coughing or sneezing into the elbow areas of your shirt or coat, into a tissue or into your sleeve (not your hands). . Avoid shaking hands with others and consider head nods or verbal greetings only. . Avoid touching your eyes, nose, or mouth with unwashed hands.  . Avoid close contact with people who are sick. . Avoid places or events with large numbers of people in one location, like concerts or sporting events. . Carefully consider travel plans you have or are making. . If you are planning any travel outside or inside the [place], visit the CDC's Travelers' Health webpage for the latest health notices. . If you have some symptoms but not all symptoms, continue to monitor at home and seek medical attention if your symptoms worsen. Marland Kitchen  If you are having a medical emergency, call 911.   [place] / e-Visit: [email or web address]         [facility]: [facility]: [phone]                   [facility]: [phone]

## 2018-07-10 NOTE — Telephone Encounter (Signed)
Patient notified of the criteria & recommendations.

## 2018-07-10 NOTE — Telephone Encounter (Signed)
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 Please follow up

## 2018-07-10 NOTE — ED Notes (Signed)
Patient transported to x-ray. ?

## 2018-07-10 NOTE — H&P (Signed)
Date: 07/10/2018               Patient Name:  Sharon Meyer MRN: [id number]  DOB: 09-01-1970 Age / Sex: 48 y.o., female   PCP: Scot Jun, FNP         Medical Service: Internal Medicine Teaching Service         Attending Physician: Dr. Evette Doffing, Mallie Mussel, *    First Contact: Dr. Sherry Ruffing Pager: [phone]  Second Contact: Dr. Philipp Ovens Pager: [phone]       After Hours (After 5p/  First Contact Pager: [phone]  weekends / holidays): Second Contact Pager: [phone]   Chief Complaint: Chest pain  History of Present Illness: This is a 48 year old female with history of iron deficiency anemia, intraductal papilloma right breast, menorrhagia, and fibroid uterus (noted on pelvic US in 2016) who presented with an episode of chest pain, she is also been having some shortness of breath and lightheadedness.  She reports the chest pain started this morning she was at work where she was lifting boxes.  She reports that it is a tight feeling, located over her left chest, with some radiation down her left arm.  She reports that it is mild and she still having some mild chest pain at this time, she is never had chest pain before.  She does note that she has a history of heavy periods, they started at the age of 63, they occur monthly, and last for 4 to 7 days.  She is not currently menstruating but is supposed to start tomorrow.  She last had her blood counts checked about 2 months ago prior to her lumpectomy, she thinks that her hemoglobin was 7 at that time.  She has never received blood transfusion.  She denies any other sources of bleeding, denies hematuria, hematemesis, hematochezia, or melena.  She denies any fevers, chills, cough, abdominal pain, muscle aches, or other pains.  She does not take any medications, including any NSAIDs.   ED course: Vital signs were WNL.  Iron studies showed an iron of 14, TIBC 304, and saturation of 3.  Nuclear psych count not elevated at 1.1.  CBC  was notable for microcytic anemia, hemoglobin 5, MCV 61.  Troponin was negative x1.  EKG showed sinus rhythm, with no ST changes.  Given her history of possible breast cancer there was a concern for PE, D dimer elevated to 0.68. CTA scan was negative.   Meds:  No outpatient medications have been marked as taking for the 07/10/18 encounter [facility] Encounter).     Allergies: Allergies as of 07/10/2018  . (No Known Allergies)   Past Medical History:  Diagnosis Date  . Cancer [facility])    Right breast cancer  . IDA (iron deficiency anemia)   . PONV (postoperative nausea and vomiting)     Family History: Sister has history of fibroids.  Mother has a history of hypertension.  Paternal aunt has breast cancer.  She also notes asthma and allergies into the family.  Social History: Denies any smoking, alcohol or drug use.  She currently lives with HER-2 children and grandchildren.  Works at [employer].   Review of Systems: A complete ROS was negative except as per HPI.   Physical Exam: Blood pressure 120/74, pulse 81, temperature 98.9 F (37.2 C), resp. rate 15, height '5\' 7"'  (1.702 m), weight 81.6 kg, SpO2 100 %. Physical Exam  Constitutional: She is oriented to person, place, and time and well-developed, well-nourished,  and in no distress.  HENT:  Head: Normocephalic and atraumatic.  Eyes:  Conjunctival pallor, EOMI, PERRLA  Neck: Normal range of motion. Neck supple. No thyromegaly present.  Cardiovascular: Normal rate, regular rhythm and normal heart sounds.  Pulmonary/Chest: Effort normal and breath sounds normal. No respiratory distress.  Abdominal: Soft. Bowel sounds are normal. She exhibits no distension.  Musculoskeletal: Normal range of motion.        General: No edema.     Comments: Delayed capillary refill  Neurological: She is alert and oriented to person, place, and time.  Skin: Skin is warm and dry.  Psychiatric: Mood and affect normal.    EKG: personally reviewed my  interpretation is normal sinus rhythm, rate of around 90, no ST changes  CXR: personally reviewed my interpretation is midline trachea, no cardiomegaly, good inspiration, increased pulmonary vasculature in right middle lobe  Assessment & Plan by Problem: Active Problems:   Symptomatic anemia  Symptomatic anemia Microcytic anemia 2/2 IDA This is a 48 year old female with history of menorrhagia, iron deficiency anemia, and right breast ductal papilloma who presented after an episode of chest pain that started this morning.  She also noted increased lightheadedness and mild shortness of breath.  On exam her vitals were stable, she did have conjunctival pallor and delayed capillary refill but otherwise was unremarkable.  Her EKG was unremarkable and troponins were negative.  Labs are significant for microcytic anemia and iron deficiency.  B12 and folate were WNL.  Given her significantly decreased hemoglobin at 5 and current presentation this appears to be more of a slow process, possibly 2/2 her heavy menstrual cycles. She denied any other sites of bleeding. Her chest pain is likely from her significant anemia.  -Transfuse 1 pRBC -Repeat CBC following transfusion -Transfuse for Hgb <7 -Iron supplements -If symptoms persist or Hgb does not improve will contact Ob-Gyn for possibly a hysterectomy given her fibroid uterus -Daily CBC -Iron supplements -BMP in AM  Chest pain: -Patient reported that she was still having some mild chest pain during our evaluation.  This started when she was exerting herself, did not have relief with rest, and is located over the left side.  She had received a dose of aspirin in the ED.  Her initial EKG showed sinus rhythm with no ST changes.  Troponins were negative.  1.   She has a heart score of 1 due to her age. This may be related to her current symptomatic anemia and I am less concerned about ACS. -Trend troponins x3 -Telemetry -Monitor symptoms  FEN: NS @  100cc/hr, replete lytes prn, regular diet VTE ppx: SCDs  Code Status: FULL    Dispo: Admit patient to Observation with expected length of stay less than 2 midnights.  Signed: Asencion Noble, MD 07/10/2018, 5:49 PM  Pager: [phone]

## 2018-07-10 NOTE — Telephone Encounter (Signed)
Left voice mail to call back 

## 2018-07-10 NOTE — ED Notes (Signed)
ED TO INPATIENT HANDOFF REPORT  ED Nurse Name and Phone #: Lovena Le [phone]  S Name/Age/Gender Sharon Meyer 48 y.o. female Room/Bed: 044C/044C  Code Status   Code Status: Not on file  Home/SNF/Other Home Patient oriented to: self, place, time and situation Is this baseline? Yes   Triage Complete: Triage complete  Chief Complaint cp  Triage Note Pt reports left sided, sharp chest pain that started around 830 this morning. Pt also reports tingling in her left upper arm, stops at her elbow. Pt reports some SOB and feeling lightheaded when the pain started but denies nausea. Pt a.o, nad noted.    Allergies No Known Allergies  Level of Care/Admitting Diagnosis ED Disposition    ED Disposition Condition [facility] Area: [place] [100100]  Level of Care: Telemetry Medical [104]  Diagnosis: Symptomatic anemia [1027253]  Admitting Physician: Axel Filler [[id number]]  Attending Physician: Axel Filler [phone]  PT Class (Do Not Modify): Observation [104]  PT Acc Code (Do Not Modify): Observation [10022]       B Medical/Surgery History Past Medical History:  Diagnosis Date  . Cancer [facility])    Right breast cancer  . IDA (iron deficiency anemia)   . PONV (postoperative nausea and vomiting)    Past Surgical History:  Procedure Laterality Date  . BREAST EXCISIONAL BIOPSY Right 08/09/2014  . BREAST LUMPECTOMY Right 05/21/2018   Procedure: RIGHT BREAST LUMPECTOMY;  Surgeon: Excell Seltzer, MD;  Location: [place];  Service: General;  Laterality: Right;  . BREAST LUMPECTOMY WITH RADIOACTIVE SEED LOCALIZATION Right 08/09/2014   Procedure: RIGHT BREAST LUMPECTOMY WITH RADIOACTIVE SEED LOCALIZATION;  Surgeon: Excell Seltzer, MD;  Location: [place];  Service: General;  Laterality: Right;  . btl    . HERNIA REPAIR     umbilical hernia     A IV  Location/Drains/Wounds Patient Lines/Drains/Airways Status   Active Line/Drains/Airways    Name:   Placement date:   Placement time:   Site:   Days:   Peripheral IV 07/10/18 Right Antecubital   07/10/18    1405    Antecubital   less than 1   Peripheral IV 07/10/18 Right Forearm   07/10/18    1507    Forearm   less than 1   Incision (Closed) 08/09/14 Breast Right   08/09/14    1621     1431   Incision (Closed) 05/21/18 Breast Right   05/21/18    0851     50          Intake/Output Last 24 hours No intake or output data in the 24 hours ending 07/10/18 1729  Labs/Imaging Results for orders placed or performed during the hospital encounter of 07/10/18 (from the past 48 hour(s))  Basic metabolic panel     Status: Abnormal   Collection Time: 07/10/18  2:06 PM  Result Value Ref Range   Sodium 135 135 - 145 mmol/L   Potassium 3.6 3.5 - 5.1 mmol/L   Chloride 108 98 - 111 mmol/L   CO2 19 (L) 22 - 32 mmol/L   Glucose, Bld 86 70 - 99 mg/dL   BUN 7 6 - 20 mg/dL   Creatinine, Ser 0.51 0.44 - 1.00 mg/dL   Calcium 8.8 (L) 8.9 - 10.3 mg/dL   GFR calc non Af Amer >60 >60 mL/min   GFR calc Af Amer >60 >60 mL/min   Anion gap 8 5 - 15  Comment: Performed at [facility], [place] [address] [place], [place] [address]  CBC     Status: Abnormal   Collection Time: 07/10/18  2:06 PM  Result Value Ref Range   WBC 7.1 4.0 - 10.5 K/uL   RBC 3.28 (L) 3.87 - 5.11 MIL/uL   Hemoglobin 5.0 (LL) 12.0 - 15.0 g/dL    Comment: REPEATED TO VERIFY Reticulocyte Hemoglobin testing may be clinically indicated, consider ordering this additional test [id number] THIS CRITICAL RESULT HAS VERIFIED AND BEEN CALLED TO Robinn Overholt,RN BY ZELDA BEECH ON 03 26 2020 AT 1438, AND HAS BEEN READ BACK.     HCT 20.0 (L) 36.0 - 46.0 %   MCV 61.0 (L) 80.0 - 100.0 fL   MCH 15.2 (L) 26.0 - 34.0 pg   MCHC 25.0 (L) 30.0 - 36.0 g/dL   RDW 23.3 (H) 11.5 - 15.5 %   Platelets 381 150 - 400 K/uL   nRBC 0.0 0.0 - 0.2 %    Comment:  Performed at [facility] [address] [place], [place] [address]  Troponin I - Once     Status: None   Collection Time: 07/10/18  2:06 PM  Result Value Ref Range   Troponin I <0.03 <0.03 ng/mL    Comment: Performed at [facility] [address], [place], [place] [address]  D-dimer, quantitative (not at [facility])     Status: Abnormal   Collection Time: 07/10/18  2:10 PM  Result Value Ref Range   D-Dimer, Quant 0.68 (H) 0.00 - 0.50 ug/mL-FEU    Comment: (NOTE) At the manufacturer cut-off of 0.50 ug/mL FEU, this assay has been documented to exclude PE with a sensitivity and negative predictive value of 97 to 99%.  At this time, this assay has not been approved by the FDA to exclude DVT/VTE. Results should be correlated with clinical presentation. Performed at [facility], [place] [address] [place], [place] [address]   Vitamin B12     Status: Abnormal   Collection Time: 07/10/18  3:06 PM  Result Value Ref Range   Vitamin B-12 1,029 (H) 180 - 914 pg/mL    Comment: (NOTE) This assay is not validated for testing neonatal or myeloproliferative syndrome specimens for Vitamin B12 levels. Performed at [facility], [place] [address] [place], [place] [address]   Folate     Status: None   Collection Time: 07/10/18  3:06 PM  Result Value Ref Range   Folate 22.4 >5.9 ng/mL    Comment: Performed at [facility], [place] [address], [place], [place] [address]  Iron and TIBC     Status: Abnormal   Collection Time: 07/10/18  3:06 PM  Result Value Ref Range   Iron 14 (L) 28 - 170 ug/dL   TIBC 504 (H) 250 - 450 ug/dL   Saturation Ratios 3 (L) 10.4 - 31.8 %   UIBC 490 ug/dL    Comment: Performed at [facility], [place] [address] [place], [place] [address]  Ferritin     Status: Abnormal   Collection Time: 07/10/18  3:06 PM  Result Value Ref Range   Ferritin 3 (L) 11 - 307 ng/mL    Comment: Performed at [facility], [place] [address] [place], [place]  [address]  Reticulocytes     Status: Abnormal   Collection Time: 07/10/18  3:06 PM  Result Value Ref Range   Retic Ct Pct 1.1 0.4 -  3.1 %   RBC. 3.37 (L) 3.87 - 5.11 MIL/uL   Retic Count, Absolute 36.7 19.0 - 186.0 K/uL   Immature Retic Fract 15.3 2.3 - 15.9 %    Comment: Performed at [facility] [address], [place], [place] [address]  Prepare RBC     Status: None   Collection Time: 07/10/18  3:06 PM  Result Value Ref Range   Order Confirmation      ORDER PROCESSED BY BLOOD BANK Performed at [facility], [place] [address] [place], [place] [address]   Type and screen [place]     Status: None (Preliminary result)   Collection Time: 07/10/18  3:06 PM  Result Value Ref Range   ABO/RH(D) B POS    Antibody Screen NEG    Sample Expiration 07/13/2018    Unit Number [id number]    Blood Component Type RED CELLS,LR    Unit division 00    Status of Unit ISSUED    Transfusion Status OK TO TRANSFUSE    Crossmatch Result      Compatible Performed at [facility], [place] [address] [place], [place] [address]   ABO/Rh     Status: None   Collection Time: 07/10/18  3:06 PM  Result Value Ref Range   ABO/RH(D)      B POS Performed at [facility] [address], [place], [place] [address]   I-Stat Beta hCG blood, ED ([facility], [facility], [facility] only)     Status: None   Collection Time: 07/10/18  3:23 PM  Result Value Ref Range   I-stat hCG, quantitative <5.0 <5 mIU/mL   Comment 3            Comment:   GEST. AGE      CONC.  (mIU/mL)   <=1 WEEK        5 - 50     2 WEEKS       50 - 500     3 WEEKS       100 - 10,000     4 WEEKS     1,000 - 30,000        FEMALE AND NON-PREGNANT FEMALE:     LESS THAN 5 mIU/mL   POC occult blood, ED     Status: None   Collection Time: 07/10/18  4:08 PM  Result Value Ref Range   Fecal Occult Bld NEGATIVE NEGATIVE   Dg Chest 2 View  Result Date: 07/10/2018 CLINICAL DATA:  48 year old female with chest pain EXAM: CHEST - 2  VIEW COMPARISON:  08/25/2007 FINDINGS: Cardiomediastinal silhouette within normal limits in size and contour. No evidence of central vascular congestion. No interlobular septal thickening. No pneumothorax or pleural effusion. No confluent airspace disease. No displaced fracture IMPRESSION: Negative for acute cardiopulmonary disease Electronically Signed   By: Corrie Mckusick D.O.   On: 07/10/2018 13:39   Ct Angio Chest Pe W And/or Wo Contrast  Result Date: 07/10/2018 CLINICAL DATA:  Hervey Ard left chest pain beginning 830 hours today. Tingling of the left arm. Short of breath. EXAM: CT ANGIOGRAPHY CHEST WITH CONTRAST TECHNIQUE: Multidetector CT imaging of the chest was performed using the standard protocol during bolus administration of intravenous contrast. Multiplanar CT image reconstructions and MIPs were obtained to evaluate the vascular anatomy. CONTRAST:  172mL OMNIPAQUE IOHEXOL 350 MG/ML SOLN COMPARISON:  Chest radiography same day. FINDINGS: Cardiovascular: Pulmonary arterial opacification is excellent. There are no pulmonary emboli. Heart size  is normal. No pericardial effusion. No coronary artery calcification. No aortic atherosclerosis. Mediastinum/Nodes: No mass or adenopathy. Lungs/Pleura: Normal Upper Abdomen: Normal except for a probable cyst in the left lobe of the liver. Musculoskeletal: Normal Review of the MIP images confirms the above findings. IMPRESSION: Negative study. No pulmonary emboli. No other acute or significant chest finding. Electronically Signed   By: Nelson Chimes M.D.   On: 07/10/2018 16:36    Pending Labs Unresulted Labs (From admission, onward)    Start     Ordered   07/10/18 2200  CBC  Once,   R    Comments:  Post transfusion    07/10/18 1723   07/10/18 1706  Troponin I - ONCE - STAT  ONCE - STAT,   STAT     07/10/18 1658   Signed and Held  HIV antibody (Routine Testing)  Tomorrow morning,   R     Signed and Held   Signed and Held  Basic metabolic panel  Tomorrow  morning,   R     Signed and Held   Signed and Held  CBC  Tomorrow morning,   R     Signed and Held          Vitals/Pain Today's Vitals   07/10/18 1445 07/10/18 1515 07/10/18 1708 07/10/18 1724  BP: (!) 114/91 111/75 91/67 104/74  Pulse: 78 69 85 96  Resp: 13 18 19  (!) 22  Temp:    98.6 F (37 C)  TempSrc:    Oral  SpO2: 98% 100% 100% 100%  Weight:      Height:      PainSc:        Isolation Precautions No active isolations  Medications Medications  0.9 %  sodium chloride infusion (has no administration in time range)  sodium chloride 0.9 % bolus 1,000 mL (0 mLs Intravenous Stopped 07/10/18 1546)  aspirin chewable tablet 324 mg (324 mg Oral Given 07/10/18 1545)  iohexol (OMNIPAQUE) 350 MG/ML injection 75 mL (100 mLs Intravenous Contrast Given 07/10/18 1615)    Mobility walks Low fall risk   Focused Assessments Cardiac Assessment Handoff:  Cardiac Rhythm: Normal sinus rhythm Lab Results  Component Value Date   TROPONINI <0.03 07/10/2018   Lab Results  Component Value Date   DDIMER 0.68 (H) 07/10/2018   Does the Patient currently have chest pain? Yes     R Recommendations: See Admitting Provider Note  Report given to:   Additional Notes: Pt rates chest pain a 2/10 at this time. First unit of blood started.

## 2018-07-10 NOTE — ED Notes (Signed)
ED Provider at bedside. 

## 2018-07-10 NOTE — Telephone Encounter (Signed)
Please advise 

## 2018-07-10 NOTE — ED Triage Notes (Signed)
Pt reports left sided, sharp chest pain that started around 830 this morning. Pt also reports tingling in her left upper arm, stops at her elbow. Pt reports some SOB and feeling lightheaded when the pain started but denies nausea. Pt a.o, nad noted.

## 2018-07-10 NOTE — ED Provider Notes (Signed)
[facility] Provider Note   CSN: [id number] Arrival date & time: 07/10/18  1259    History   Chief Complaint Chief Complaint  Patient presents with  . Chest Pain  . Tingling    HPI Sharon Meyer is a 48 y.o. female with history of iron deficiency anemia, right breast abnormality presents for evaluation of acute onset, somewhat improving left-sided chest pain beginning at around 8:30 AM today while at work.  She reports that while lifting boxes she began to develop aching chest pressure that is left-sided and began to radiate into the left upper extremity.  She notes associated nausea, lightheadedness, and shortness of breath however states that she is frequently nauseated and lightheaded due to her anemia.  She denies syncope, diaphoresis, or vomiting.  No abdominal pain, fever, cough.  She is a non-smoker, denies recreational drug use or alcohol intake.  Her symptoms worsened with getting up and walking around.  No alleviating factors noted.  She has not tried anything for her symptoms.     The history is provided by the patient.    Past Medical History:  Diagnosis Date  . Cancer [facility])    Right breast cancer  . IDA (iron deficiency anemia)   . PONV (postoperative nausea and vomiting)     Patient Active Problem List   Diagnosis Date Noted  . Symptomatic anemia 07/10/2018  . Abnormality of breast on screening mammography 01/30/2018  . 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 BREAST EXCISIONAL BIOPSY Right 08/09/2014  . BREAST LUMPECTOMY Right 05/21/2018   Procedure: RIGHT BREAST LUMPECTOMY;  Surgeon: Excell Seltzer, MD;  Location: [facility];  Service: General;  Laterality: Right;  . BREAST LUMPECTOMY WITH RADIOACTIVE SEED LOCALIZATION Right 08/09/2014   Procedure: RIGHT BREAST LUMPECTOMY WITH RADIOACTIVE SEED LOCALIZATION;  Surgeon: Excell Seltzer,  MD;  Location: [facility];  Service: General;  Laterality: Right;  . btl    . HERNIA REPAIR     umbilical hernia     OB History   No obstetric history on file.      Home Medications    Prior to Admission medications   Medication Sig Start Date End Date Taking? Authorizing Provider  oxyCODONE (OXY IR/ROXICODONE) 5 MG immediate release tablet Take 1 tablet (5 mg total) by mouth every 6 (six) hours as needed for moderate pain, severe pain or breakthrough pain. 05/21/18   Excell Seltzer, MD    Family History Family History  Problem Relation Age of Onset  . Healthy Mother   . Healthy Father     Social History Social History   Tobacco Use  . Smoking status: Never Smoker  . Smokeless tobacco: Never Used  Substance Use Topics  . Alcohol use: No  . Drug use: No     Allergies   Patient has no known allergies.   Review of Systems Review of Systems  Constitutional: Negative for diaphoresis and fever.  Respiratory: Positive for shortness of breath. Negative for cough.   Cardiovascular: Positive for chest pain. Negative for leg swelling.  Gastrointestinal: Positive for nausea. Negative for abdominal pain and vomiting.  Neurological: Positive for light-headedness.  All other systems reviewed and are negative.    Physical Exam Updated Vital Signs BP 104/74   Pulse 96   Temp 98.6 F (37 C) (Oral)   Resp (!) 22   Ht 5\' 7"  (1.702 m)  Wt 81.6 kg   SpO2 100%   BMI 28.19 kg/m   Physical Exam Vitals signs and nursing note reviewed.  Constitutional:      General: She is not in acute distress.    Appearance: She is well-developed.  HENT:     Head: Normocephalic and atraumatic.  Eyes:     General:        Right eye: No discharge.        Left eye: No discharge.     Conjunctiva/sclera: Conjunctivae normal.  Neck:     Musculoskeletal: Normal range of motion and neck supple.     Vascular: No JVD.     Trachea: No tracheal deviation.  Cardiovascular:      Rate and Rhythm: Normal rate and regular rhythm.     Pulses:          Carotid pulses are 2+ on the right side and 2+ on the left side.      Radial pulses are 2+ on the right side and 2+ on the left side.       Dorsalis pedis pulses are 2+ on the right side and 2+ on the left side.       Posterior tibial pulses are 2+ on the right side and 2+ on the left side.     Heart sounds: Normal heart sounds.     Comments: Homans sign absent bilaterally, no lower extremity edema, no palpable cords, compartments are soft  Pulmonary:     Effort: Pulmonary effort is normal.     Breath sounds: Normal breath sounds.  Chest:     Chest wall: No tenderness.  Abdominal:     General: Bowel sounds are normal. There is no distension.     Palpations: Abdomen is soft. There is no mass.     Tenderness: There is no abdominal tenderness. There is no guarding or rebound.  Genitourinary:    Comments: Examination performed in the presence of a chaperone.  No frank rectal bleeding.  No anal fissures or tears.  Moderate amount of light brown stool in the rectal vault.  No hemorrhoids. Musculoskeletal:     Right lower leg: She exhibits no tenderness. No edema.     Left lower leg: She exhibits no tenderness. No edema.  Skin:    General: Skin is warm and dry.     Findings: No erythema.  Neurological:     Mental Status: She is alert.  Psychiatric:        Behavior: Behavior normal.      ED Treatments / Results  Labs (all labs ordered are listed, but only abnormal results are displayed) Labs Reviewed  BASIC METABOLIC PANEL - Abnormal; Notable for the following components:      Result Value   CO2 19 (*)    Calcium 8.8 (*)    All other components within normal limits  CBC - Abnormal; Notable for the following components:   RBC 3.28 (*)    Hemoglobin 5.0 (*)    HCT 20.0 (*)    MCV 61.0 (*)    MCH 15.2 (*)    MCHC 25.0 (*)    RDW 23.3 (*)    All other components within normal limits  D-DIMER,  QUANTITATIVE (NOT AT [facility]) - Abnormal; Notable for the following components:   D-Dimer, Quant 0.68 (*)    All other components within normal limits  VITAMIN B12 - Abnormal; Notable for the following components:   Vitamin B-12 1,029 (*)    All  other components within normal limits  IRON AND TIBC - Abnormal; Notable for the following components:   Iron 14 (*)    TIBC 504 (*)    Saturation Ratios 3 (*)    All other components within normal limits  FERRITIN - Abnormal; Notable for the following components:   Ferritin 3 (*)    All other components within normal limits  RETICULOCYTES - Abnormal; Notable for the following components:   RBC. 3.37 (*)    All other components within normal limits  TROPONIN I  FOLATE  TROPONIN I  CBC  POC OCCULT BLOOD, ED  I-STAT BETA HCG BLOOD, ED ([facility], [facility], [facility] ONLY)  PREPARE RBC (CROSSMATCH)  TYPE AND SCREEN  ABO/RH    EKG EKG Interpretation  Date/Time:  Thursday July 10 2018 13:11:30 EDT Ventricular Rate:  88 PR Interval:    QRS Duration: 103 QT Interval:  374 QTC Calculation: 453 R Axis:   54 Text Interpretation:  Sinus rhythm Confirmed by Isla Pence [phone]) on 07/10/2018 1:22:21 PM   Radiology Dg Chest 2 View  Result Date: 07/10/2018 CLINICAL DATA:  48 year old female with chest pain EXAM: CHEST - 2 VIEW COMPARISON:  08/25/2007 FINDINGS: Cardiomediastinal silhouette within normal limits in size and contour. No evidence of central vascular congestion. No interlobular septal thickening. No pneumothorax or pleural effusion. No confluent airspace disease. No displaced fracture IMPRESSION: Negative for acute cardiopulmonary disease Electronically Signed   By: Corrie Mckusick D.O.   On: 07/10/2018 13:39   Ct Angio Chest Pe W And/or Wo Contrast  Result Date: 07/10/2018 CLINICAL DATA:  Hervey Ard left chest pain beginning 830 hours today. Tingling of the left arm. Short of breath. EXAM: CT ANGIOGRAPHY CHEST WITH CONTRAST TECHNIQUE: Multidetector CT  imaging of the chest was performed using the standard protocol during bolus administration of intravenous contrast. Multiplanar CT image reconstructions and MIPs were obtained to evaluate the vascular anatomy. CONTRAST:  134mL OMNIPAQUE IOHEXOL 350 MG/ML SOLN COMPARISON:  Chest radiography same day. FINDINGS: Cardiovascular: Pulmonary arterial opacification is excellent. There are no pulmonary emboli. Heart size is normal. No pericardial effusion. No coronary artery calcification. No aortic atherosclerosis. Mediastinum/Nodes: No mass or adenopathy. Lungs/Pleura: Normal Upper Abdomen: Normal except for a probable cyst in the left lobe of the liver. Musculoskeletal: Normal Review of the MIP images confirms the above findings. IMPRESSION: Negative study. No pulmonary emboli. No other acute or significant chest finding. Electronically Signed   By: Nelson Chimes M.D.   On: 07/10/2018 16:36    Procedures .Critical Care Performed by: Renita Papa, PA-C Authorized by: Renita Papa, PA-C   Critical care provider statement:    Critical care time (minutes):  35   Critical care was necessary to treat or prevent imminent or life-threatening deterioration of the following conditions:  Circulatory failure   Critical care was time spent personally by me on the following activities:  Discussions with consultants, evaluation of patient's response to treatment, examination of patient, ordering and performing treatments and interventions, ordering and review of laboratory studies, ordering and review of radiographic studies, pulse oximetry, re-evaluation of patient's condition, obtaining history from patient or surrogate and review of old charts   I assumed direction of critical care for this patient from another provider in my specialty: no     (including critical care time)  Medications Ordered in ED Medications  0.9 %  sodium chloride infusion (has no administration in time range)  sodium chloride 0.9 % bolus  1,000 mL (0 mLs  Intravenous Stopped 07/10/18 1546)  aspirin chewable tablet 324 mg (324 mg Oral Given 07/10/18 1545)  iohexol (OMNIPAQUE) 350 MG/ML injection 75 mL (100 mLs Intravenous Contrast Given 07/10/18 1615)     Initial Impression / Assessment and Plan / ED Course  I have reviewed the triage vital signs and the nursing notes.  Pertinent labs & imaging results that were available during my care of the patient were reviewed by me and considered in my medical decision making (see chart for details).        Patient presenting for evaluation of left-sided chest pain, shortness of breath, lightheadedness, nausea, fatigue.  She is afebrile, vital signs are stable.  She does become tachycardic with any activity.  She is nontoxic in appearance.  Chest x-ray shows no evidence of acute cardiopulmonary abnormalities.  EKG shows normal sinus rhythm and initial troponin is negative. HEART score of 3, low risk for ACS/MI.  Lab work reviewed by me significant for hemoglobin of 5.  She reports that she has a known history of iron deficiency anemia and has very heavy periods. Hemoccult is negative. Does not appear to be on any iron supplementation.  Suspect her exertional chest pain is secondary to her anemia.  Initiated transfusion of 2 units of packed red blood cells.  Of note, a d-dimer was obtained due to her recent lumpectomy in February and history of breast cancer.  CTA of the chest shows no evidence of PE.  Internal medicine teaching service to admit.  Patient appears hemodynamically stable at this time. Final Clinical Impressions(s) / ED Diagnoses   Final diagnoses:  Symptomatic anemia  Left-sided chest pain    ED Discharge Orders    None       Debroah Baller 07/10/18 1738    Isla Pence, MD 07/11/18 1549

## 2018-07-11 DIAGNOSIS — D500 Iron deficiency anemia secondary to blood loss (chronic): Secondary | ICD-10-CM | POA: Diagnosis not present

## 2018-07-11 DIAGNOSIS — N920 Excessive and frequent menstruation with regular cycle: Secondary | ICD-10-CM

## 2018-07-11 DIAGNOSIS — R079 Chest pain, unspecified: Secondary | ICD-10-CM | POA: Diagnosis not present

## 2018-07-11 DIAGNOSIS — D509 Iron deficiency anemia, unspecified: Secondary | ICD-10-CM | POA: Diagnosis present

## 2018-07-11 DIAGNOSIS — Z9889 Other specified postprocedural states: Secondary | ICD-10-CM | POA: Diagnosis not present

## 2018-07-11 LAB — BPAM RBC
Blood Product Expiration Date: 202004112359
Blood Product Expiration Date: 202004112359
ISSUE DATE / TIME: 202003261715
ISSUE DATE / TIME: 202003262315
Unit Type and Rh: 7300
Unit Type and Rh: 7300

## 2018-07-11 LAB — BASIC METABOLIC PANEL
Anion gap: 5 (ref 5–15)
BUN: 6 mg/dL (ref 6–20)
CO2: 21 mmol/L — ABNORMAL LOW (ref 22–32)
Calcium: 8.6 mg/dL — ABNORMAL LOW (ref 8.9–10.3)
Chloride: 112 mmol/L — ABNORMAL HIGH (ref 98–111)
Creatinine, Ser: 0.5 mg/dL (ref 0.44–1.00)
GFR calc Af Amer: >60 mL/min (ref >60)
GFR calc non Af Amer: >60 mL/min (ref >60)
GLUCOSE: 87 mg/dL (ref 70–99)
Potassium: 3.6 mmol/L (ref 3.5–5.1)
Sodium: 138 mmol/L (ref 135–145)

## 2018-07-11 LAB — TYPE AND SCREEN
ABO/RH(D): B POS
Antibody Screen: NEGATIVE
UNIT DIVISION: 0
Unit division: 0

## 2018-07-11 LAB — HEMOGLOBIN AND HEMATOCRIT, BLOOD
HCT: 25.1 % — ABNORMAL LOW (ref 36.0–46.0)
Hemoglobin: 7.3 g/dL — ABNORMAL LOW (ref 12.0–15.0)

## 2018-07-11 LAB — HIV ANTIBODY (ROUTINE TESTING W REFLEX): HIV Screen 4th Generation wRfx: NONREACTIVE

## 2018-07-11 LAB — CBC
HCT: 24.3 % — ABNORMAL LOW (ref 36.0–46.0)
Hemoglobin: 6.9 g/dL — CL (ref 12.0–15.0)
MCH: 19 pg — ABNORMAL LOW (ref 26.0–34.0)
MCHC: 28.4 g/dL — ABNORMAL LOW (ref 30.0–36.0)
MCV: 66.8 fL — ABNORMAL LOW (ref 80.0–100.0)
Platelets: 317 10*3/uL (ref 150–400)
RBC: 3.64 MIL/uL — ABNORMAL LOW (ref 3.87–5.11)
RDW: 28.2 % — ABNORMAL HIGH (ref 11.5–15.5)
WBC: 6.2 10*3/uL (ref 4.0–10.5)
nRBC: 0 % (ref 0.0–0.2)

## 2018-07-11 MED ORDER — SODIUM CHLORIDE 0.9 % IV SOLN
510.0000 mg | Freq: Once | INTRAVENOUS | Status: AC
  Administered 2018-07-11: 510 mg via INTRAVENOUS
  Filled 2018-07-11: qty 17

## 2018-07-11 MED ORDER — POLYSACCHARIDE IRON COMPLEX 150 MG PO CAPS: 150.0000 mg | ORAL_CAPSULE | Freq: Every day | ORAL | 0 refills | Status: DC

## 2018-07-11 MED ORDER — NORGESTIMATE-ETH ESTRADIOL 0.25-35 MG-MCG PO TABS: 1.0000 | ORAL_TABLET | Freq: Every day | ORAL | 0 refills | Status: DC

## 2018-07-11 NOTE — Progress Notes (Addendum)
Subjective: Sharon Meyer reports that she is doing well today, no acute events overnight.  She denies any shortness of breath, lightheadedness, dizziness.  She still has some slight chest pain.  She reports that she has been up and to the windows and bathroom with no issues.  She has not started her period yet today.  She denies any issues with her bowel movements, has about 1 a day, and it is normally brown.  We discussed that her hemoglobin was very low and I think it may be related to her fibroids.  She reported that she did need iron transfusions in 2015 but has never needed a blood transfusion before. She had been on contraceptive pills but that was years ago. We discussed that since her Hgb was so low that we would like to start her on iron and a hormonal pill to decrease her bleeding, she is in agreement with the plan.   Objective:  Vital signs in last 24 hours: Vitals:   07/10/18 2306 07/10/18 2337 07/11/18 0210 07/11/18 0559  BP: 111/80 108/67 105/64 (!) 107/58  Pulse: 80 78 77 72  Resp: 18 16 14 18   Temp: 99.1 F (37.3 C) 99.1 F (37.3 C) 98.4 F (36.9 C) 98.7 F (37.1 C)  TempSrc: Oral Oral Oral Oral  SpO2: 100% 100% 100% 100%  Weight:      Height:        General: Well appearing, NAD, resting comfortably Cardiac: RRR, no m/r/g Pulmonary: CTABL, no wheezing or rhonchi Abdomen: Soft, non-tender, non-distended, no palpable mass appreciated Extremity:No LE edema  Assessment/Plan:  Active Problems:   Symptomatic anemia  This is a 48 year old female with history of menorrhagia, iron deficiency anemia, and right breast ductal papilloma who presented after an episode of chest pain that started this morning.  She also noted increased lightheadedness and mild shortness of breath.  On exam her vitals were stable, she did have conjunctival pallor and delayed capillary refill but otherwise was unremarkable.  Her EKG was unremarkable and troponins were negative.  Labs are  significant for microcytic anemia and iron deficiency.  B12 and folate were WNL.   Symptomatic anemia Iron deficiency anemia 2/2 menorrhagia Patient received 2 units of packed red blood cells, hemoglobin this morning was 7.3.  Her vitals are stable this morning and she reports improvement in her chest pain shortness of breath.  She was able to ambulate with no issues.  Her reticulocyte count percent was 1.1 reticulocyte index was 0.26, indicating hypo-proliferation, possibly due to low iron.  She reports that she does eat greens in meat occasionally.  She may not be receiving enough iron in her diet or absorbing enough iron.  Given the drastic decrease in her hemoglobin on admission I feel that we should add iron supplements as well as start on hormonal therapy to decrease her menorrhagia. She did not have any obstructive symptoms from her fibroid uterus, she does not need urgent surgical evaluation however this should be considered if OCPs do not control her excessive bleeding.  -IV Feraheme today -Elemental iron daily on discharge -Start oral contraceptive on discharge -She will need Ob-Gyn follow up for her fibroids -F/u with PCP to repeat labs next week  Chest pain: -Troponins were negative, EKG showed no ischemic changes. Given her low Heart score this is less concerning for an ACS.  -Continue to monitor for now  FEN: No fluids, replete lytes prn, regular diet  VTE ppx: SCDs Code Status: FULL  Dispo: Anticipated discharge in approximately today.   Asencion Noble, MD 07/11/2018, 6:37 AM Pager: [phone]

## 2018-07-11 NOTE — Discharge Summary (Signed)
Name: Sharon Meyer MRN: [id number] DOB: 1971-03-31 48 y.o. PCP: Scot Jun, FNP  Date of Admission: 07/10/2018 12:59 PM Date of Discharge: 07/11/2018 Attending Physician: Axel Filler, MD  Discharge Diagnosis: 1. Symptomatic anemia 2. Iron deficiency anemia  Discharge Medications: Allergies as of 07/11/2018   No Known Allergies     Medication List    TAKE these medications   iron polysaccharides 150 MG capsule Commonly known as:  Nu-Iron Take 1 capsule (150 mg total) by mouth daily.   norgestimate-ethinyl estradiol 0.25-35 MG-MCG tablet Commonly known as:  Sprintec 28 Take 1 tablet by mouth daily.       Disposition and follow-up:   Ms.Sharon Meyer was discharged from [facility] in Stable condition.  At the hospital follow up visit please address:  1. Symptomatic anemia, iron deficiency anemia: She received 2 units pRBC and IV iron, started on supplemental iron and birth control to decrease menorrhagia due to fibroids. Please recheck labs and assess anemic symptoms. If she develops obstructive symptoms or her Hgb is not stable consider Ob-gyn referral.   2.  Labs / imaging needed at time of follow-up: CBC, BMP  3.  Pending labs/ test needing follow-up: None  Follow-up Appointments: Follow-up Information    Scot Jun, FNP Follow up.   Specialty:  Family Medicine Why:  Go to your already scheduled appointment next week. Contact information: [address] [place] [id number] [phone]           Hospital Course by problem list: 1.  Symptomatic anemia, iron deficiency anemia: This is a 48 year old female with history of menorrhagia, iron deficiency anemia, and right breast ductal papilloma initially presented after an episode of chest pain, noted to have increased lightheadedness and some shortness of breath.  Her labs were checked 2 months ago and had a hemoglobin of 7.  Fibroid  uterus was noted on a pelvic ultrasound in 2016, she had never received blood transfusions for but has received IV iron in the past.  On exam her vitals were stable, she did have some conjunctival pallor and delayed capillary refill.  Labs are significant for microcytic anemia, iron deficiency with an iron of 14, TIBC 304, saturation 3, and ferritin of 3.  Her reticulocyte index was 0.26.  Hemoglobin was down to 5, MCV 61.  She was transfused 2 units of packed red blood cells and hemoglobin increased to 7.3.  She was given a dose of IV Feraheme.  Patient was feeling well and had symptomatic improvement.  She was given a prescription for OCP to decrease her menorrhagia and a prescription for elemental iron.   2. Chest pain: She initially presented with chest pain that occurred while at work, located over her left side her arm.  EKG was nonischemic and troponins were negative.  Heart score of 1 due to age. Thought to be due to her symptomatic anemia.   Discharge Vitals:   BP (!) 107/58 (BP Location: Left Arm)   Pulse 72   Temp 98.7 F (37.1 C) (Oral)   Resp 18   Ht 5\' 7"  (1.702 m)   Wt 81.6 kg   SpO2 100%   BMI 28.19 kg/m   Pertinent Labs, Studies, and Procedures:  CBC Latest Ref Rng & Units 07/11/2018 07/11/2018 07/10/2018  WBC 4.0 - 10.5 K/uL - 6.2 8.0  Hemoglobin 12.0 - 15.0 g/dL 7.3(L) 6.9(LL) 6.4(LL)  Hematocrit 36.0 - 46.0 % 25.1(L) 24.3(L) 23.8(L)  Platelets 150 -  400 K/uL - 317 359   BMP Latest Ref Rng & Units 07/11/2018 07/10/2018 07/20/2014  Glucose 70 - 99 mg/dL 87 86 77  BUN 6 - 20 mg/dL 6 7 7.3  Creatinine 0.44 - 1.00 mg/dL 0.50 0.51 0.7  Sodium 135 - 145 mmol/L 138 135 140  Potassium 3.5 - 5.1 mmol/L 3.6 3.6 3.7  Chloride 98 - 111 mmol/L 112(H) 108 -  CO2 22 - 32 mmol/L 21(L) 19(L) 23  Calcium 8.9 - 10.3 mg/dL 8.6(L) 8.8(L) 9.1     Discharge Instructions: Discharge Instructions    Call MD for:  extreme fatigue   Complete by:  As directed    Call MD for:  persistant  dizziness or light-headedness   Complete by:  As directed    Call MD for:  persistant nausea and vomiting   Complete by:  As directed    Call MD for:  severe uncontrolled pain   Complete by:  As directed    Call MD for:  temperature >100.4   Complete by:  As directed    Diet - low sodium heart healthy   Complete by:  As directed    Discharge instructions   Complete by:  As directed    [place],   It has been a pleasure working with you and we are glad you're feeling better. You were hospitalized for low blood counts, we think that this is due to heavy periods and fibroids. You needed a blood transfusion and IV iron. You should start taking iron supplements and oral contraceptive pills. You can start taking the [place] on Sunday. I have sent in a prescription to your pharmacy. Please follow up with your PCP next week for repeat labs.   If your symptoms worsen or you develop new symptoms, please seek medical help whether it is your primary care provider or emergency department.  If you have any questions about this hospitalization please call [phone].   Increase activity slowly   Complete by:  As directed       Signed: Asencion Noble, MD 07/11/2018, 1:10 PM   Pager: [phone]

## 2018-07-11 NOTE — Progress Notes (Signed)
Pt finished receiving blood at 0205. I called lab to let them know her blood transfusion had just finished and she would need the H&H drawn at 0400 and not 0300. Phlebotomist agreed to wait to draw labs closer to 0400.

## 2018-07-11 NOTE — Discharge Instructions (Signed)
Sharon Meyer,   It has been a pleasure working with you and we are glad you're feeling better. You were hospitalized for low blood counts, we think that this is due to heavy periods and fibroids. You needed a blood transfusion and IV iron. You should start taking iron supplements and oral contraceptive pills. You can start taking the [place] on Sunday. I have sent in a prescription to your pharmacy. Please follow up with your PCP next week for repeat labs.   If your symptoms worsen or you develop new symptoms, please seek medical help whether it is your primary care provider or emergency department.  If you have any questions about this hospitalization please call [phone].

## 2018-07-11 NOTE — Plan of Care (Signed)
  Problem: Clinical Measurements: Goal: Ability to maintain clinical measurements within normal limits will improve Outcome: Progressing Goal: Will remain free from infection Outcome: Progressing Goal: Diagnostic test results will improve Outcome: Progressing Goal: Respiratory complications will improve Outcome: Progressing   Problem: Activity: Goal: Risk for activity intolerance will decrease Outcome: Progressing   Problem: Nutrition: Goal: Adequate nutrition will be maintained Outcome: Progressing   Problem: Safety: Goal: Ability to remain free from injury will improve Outcome: Progressing   Problem: Skin Integrity: Goal: Risk for impaired skin integrity will decrease Outcome: Progressing   

## 2018-07-11 NOTE — Progress Notes (Signed)
Nsg Discharge Note  Admit Date:  07/10/2018 Discharge date: 07/11/2018   Sherlene Shams Serres to be D/C'd Home per MD order.  AVS completed.  Copy for chart, and copy for patient signed, and dated. Patient/caregiver able to verbalize understanding.  Discharge Medication: Allergies as of 07/11/2018   No Known Allergies     Medication List    TAKE these medications   iron polysaccharides 150 MG capsule Commonly known as:  Nu-Iron Take 1 capsule (150 mg total) by mouth daily.   norgestimate-ethinyl estradiol 0.25-35 MG-MCG tablet Commonly known as:  Sprintec 28 Take 1 tablet by mouth daily.       Discharge Assessment: Vitals:   07/11/18 0210 07/11/18 0559  BP: 105/64 (!) 107/58  Pulse: 77 72  Resp: 14 18  Temp: 98.4 F (36.9 C) 98.7 F (37.1 C)  SpO2: 100% 100%   Skin clean, dry and intact without evidence of skin break down, no evidence of skin tears noted. IV catheter discontinued intact. Site without signs and symptoms of complications - no redness or edema noted at insertion site, patient denies c/o pain - only slight tenderness at site.  Dressing with slight pressure applied.  D/c Instructions-Education: Discharge instructions given to patient/family with verbalized understanding. D/c education completed with patient/family including follow up instructions, medication list, d/c activities limitations if indicated, with other d/c instructions as indicated by MD - patient able to verbalize understanding, all questions fully answered. Patient instructed to return to ED, call 911, or call MD for any changes in condition.  Patient escorted via [place], and D/C home via private auto.  Tresa Endo, RN 07/11/2018 12:39 PM

## 2018-09-15 ENCOUNTER — Telehealth: Payer: Self-pay | Admitting: Family Medicine

## 2018-09-15 MED ORDER — POLYSACCHARIDE IRON COMPLEX 150 MG PO CAPS: 150.0000 mg | ORAL_CAPSULE | Freq: Every day | ORAL | 0 refills | Status: DC

## 2018-09-15 NOTE — Telephone Encounter (Signed)
Please advise.   Patient last seen 02/2018. Had a hospital visit in 06/2018 for symptomatic anemia.

## 2018-09-15 NOTE — Telephone Encounter (Signed)
Caller Name: Candelaria Pies  Reason for Call:  Medication refill for iron polysaccharides (NU-IRON) 150 MG capsule [[id number]]    If this is a medication request: confirm pharmacy   [place] [address] call back number: [phone]    Action taken by recipient of request:

## 2018-09-15 NOTE — Telephone Encounter (Signed)
Okay to refill for 30 days. Patient needs a follow-up visit to evaluate anemia.

## 2018-09-15 NOTE — Telephone Encounter (Signed)
30 day refill sent to pharmacy.  Patient needs a follow up appointment.

## 2018-11-03 ENCOUNTER — Other Ambulatory Visit: Payer: Self-pay

## 2018-11-03 ENCOUNTER — Telehealth: Payer: Self-pay | Admitting: Family Medicine

## 2018-11-03 DIAGNOSIS — Z20822 Contact with and (suspected) exposure to covid-19: Secondary | ICD-10-CM

## 2018-11-03 DIAGNOSIS — Z20828 Contact with and (suspected) exposure to other viral communicable diseases: Secondary | ICD-10-CM

## 2018-11-03 NOTE — Telephone Encounter (Signed)
Patient called and asked to be tested sent her to [facility]

## 2018-11-05 LAB — NOVEL CORONAVIRUS, NAA: SARS-CoV-2, NAA: NOT DETECTED

## 2019-02-27 ENCOUNTER — Other Ambulatory Visit: Payer: Self-pay

## 2019-02-27 DIAGNOSIS — Z20822 Contact with and (suspected) exposure to covid-19: Secondary | ICD-10-CM

## 2019-03-02 LAB — NOVEL CORONAVIRUS, NAA: SARS-CoV-2, NAA: NOT DETECTED

## 2019-04-14 IMAGING — MR MR RIGHT BREAST WITHOUT AND WITH CONTRAST
6 of 8 series · 34 of 48 positions shown · IV contrast (8ml gadavist)
Comparison: Previous exam(s).

CLINICAL DATA: 46-year-old female presenting for MRI guided biopsy
of a right breast mass.

LABS:  Not applicable.
EXAM:
MR OF THE RIGHT BREAST WITH AND WITHOUT CONTRAST
TECHNIQUE: Multiplanar, multisequence MR images of the right breast were
obtained prior to and following the intravenous administration of 8
mL of Gadavist.

[Series 2: fiducial unilateral · sagittal · 2.0mm · 1.33mm/px · 1 of 52 slices shown]
[im 1/52]
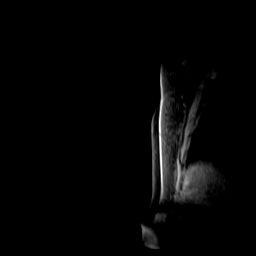

[Series 3: dynamic pre · axial · non-contrast · 1.3mm · 0.73mm/px · z∈[-95,+133]mm · 6 of 176 slices shown]
[im 1/176]
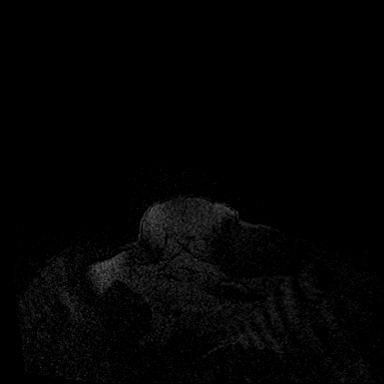
[im 36/176]
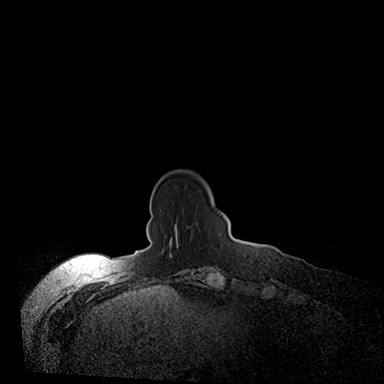
[im 71/176]
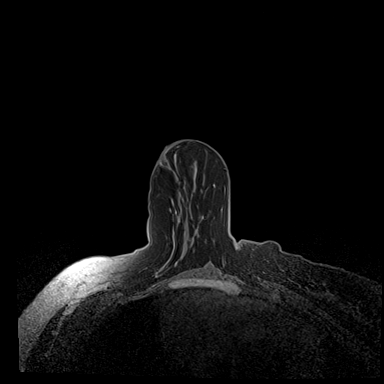
[im 106/176]
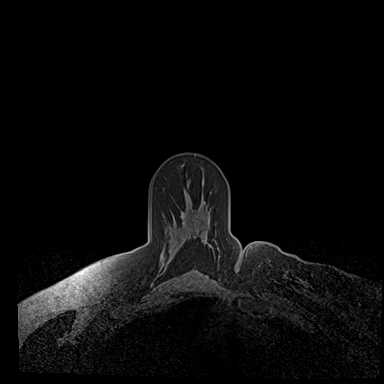
[im 141/176]
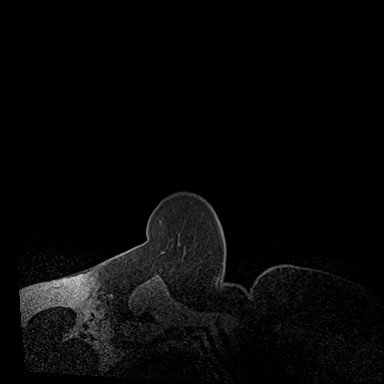
[im 176/176]
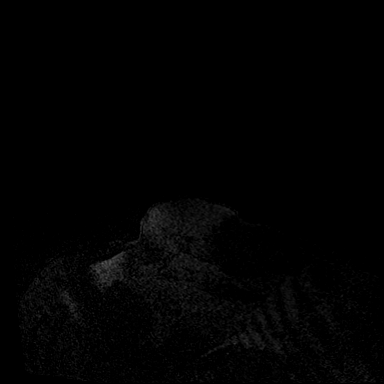

[Series 4: dynamic post 20 · axial · 1.3mm · 0.73mm/px · z∈[-95,+133]mm · 6 of 176 slices shown (1 of 2)]
[im 1/176]
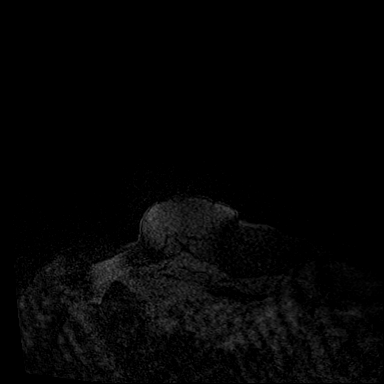
[im 36/176]
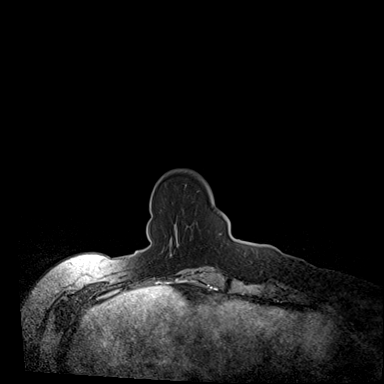
[im 71/176]
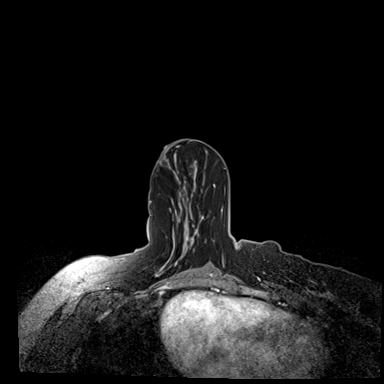
[im 106/176]
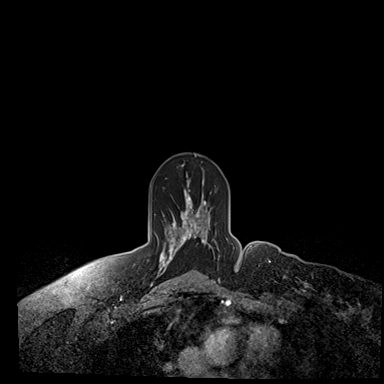
[im 141/176]
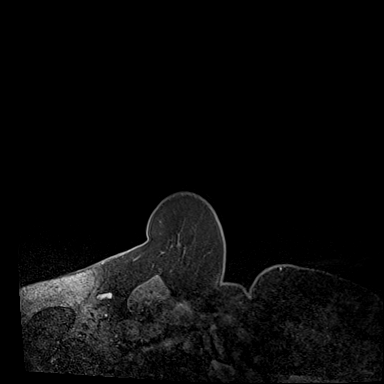
[im 176/176]
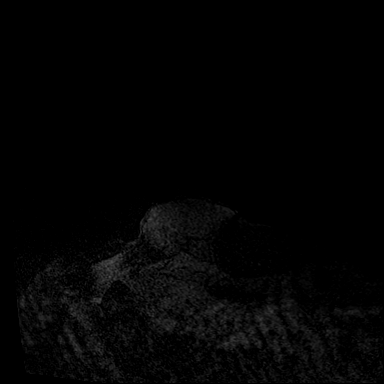

[Series 5: dynamic post 20 · axial · 1.3mm · 0.73mm/px · z∈[-95,+133]mm · 7 of 176 slices shown (2 of 2)]
[im 1/176]
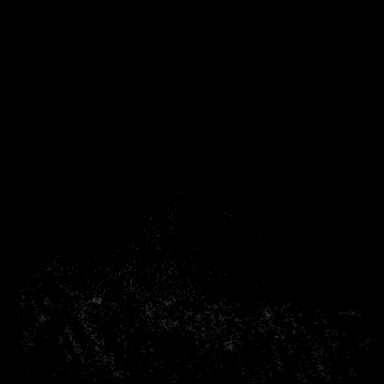
[im 30/176]
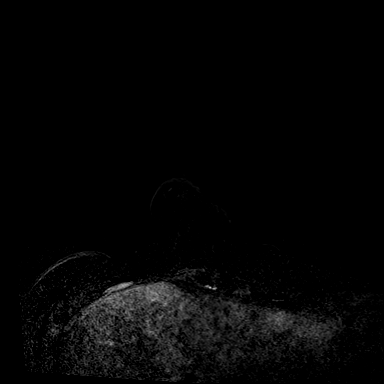
[im 59/176]
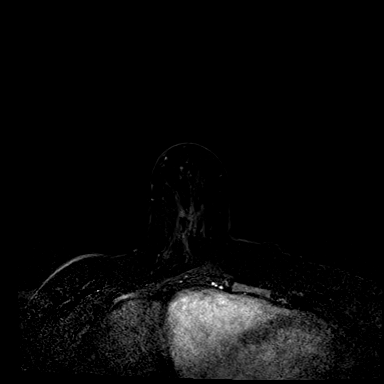
[im 88/176]
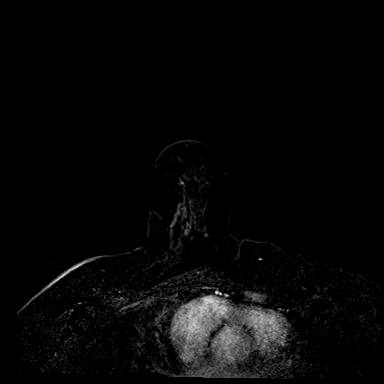
[im 117/176]
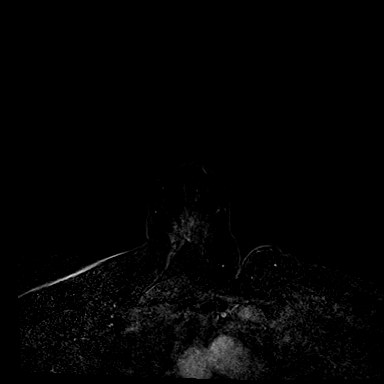
[im 146/176]
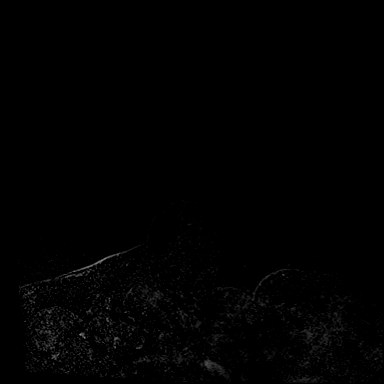
[im 176/176]
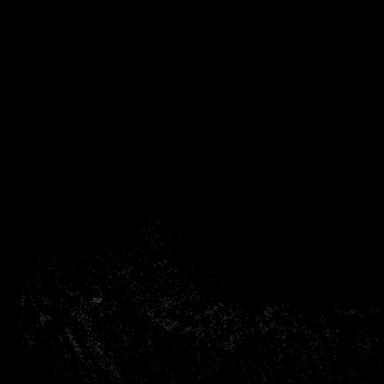

[Series 6: dynamic post 3 · axial · 1.3mm · 0.73mm/px · z∈[-95,+133]mm · 7 of 176 slices shown (1 of 2)]
[im 1/176]
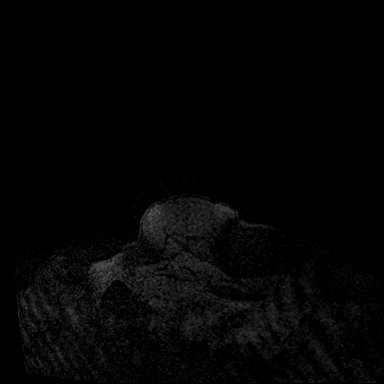
[im 30/176]
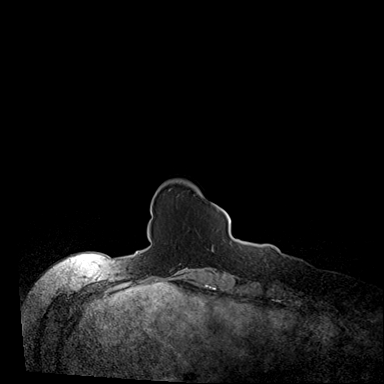
[im 59/176]
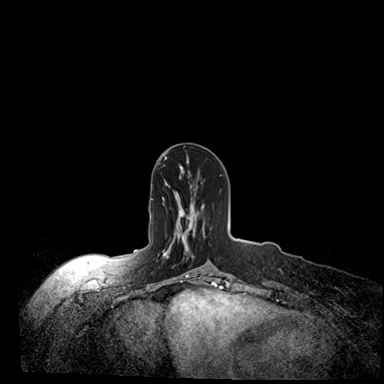
[im 88/176]
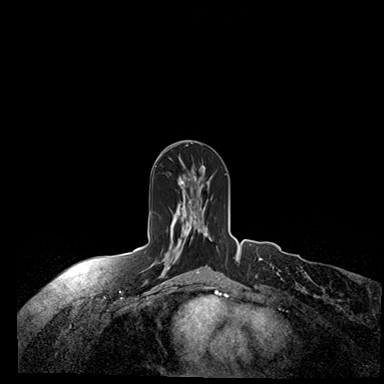
[im 117/176]
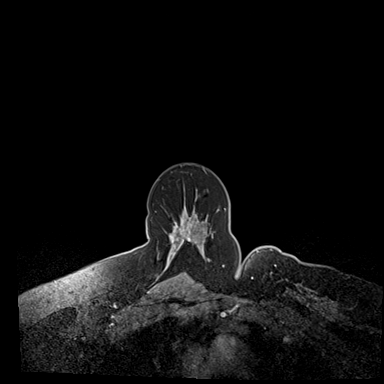
[im 146/176]
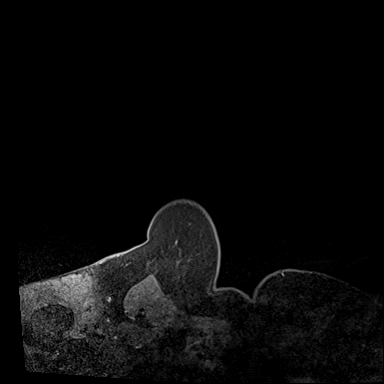
[im 176/176]
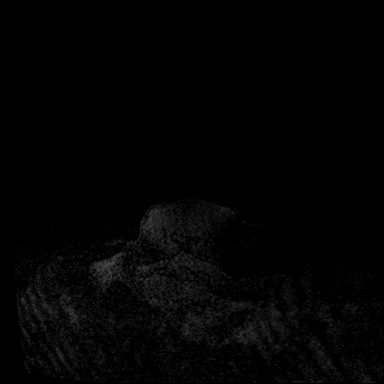

[Series 7: dynamic post 3 · axial · 1.3mm · 0.73mm/px · z∈[-95,+133]mm · 7 of 176 slices shown (2 of 2)]
[im 1/176]
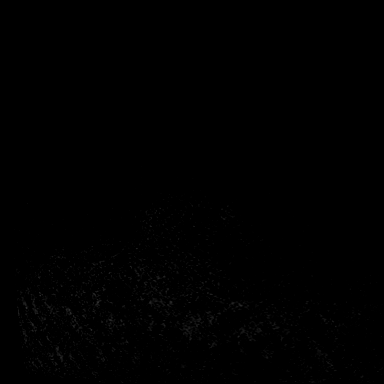
[im 30/176]
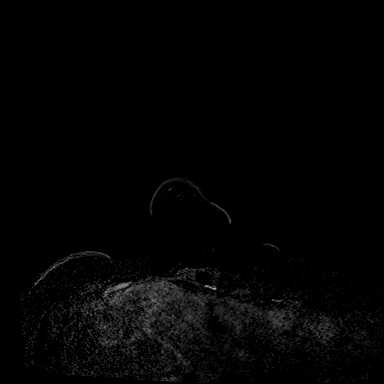
[im 59/176]
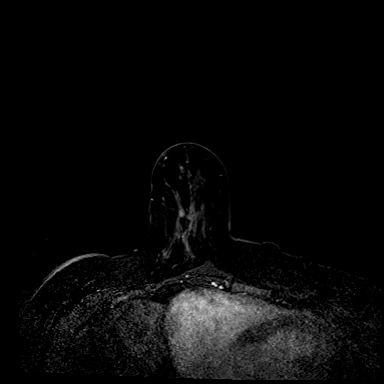
[im 88/176]
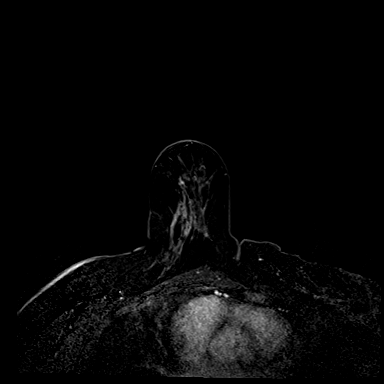
[im 117/176]
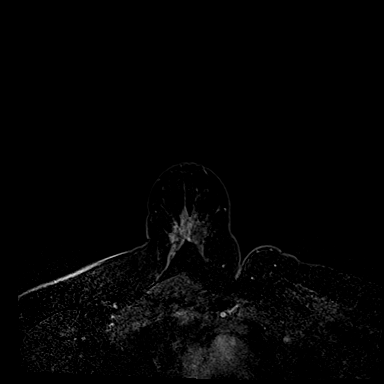
[im 146/176]
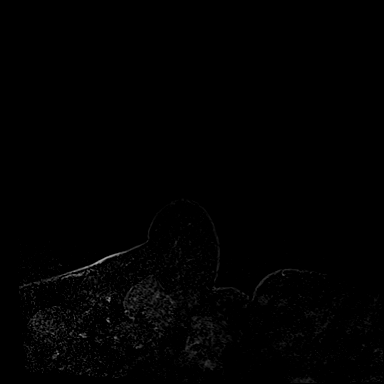
[im 176/176]
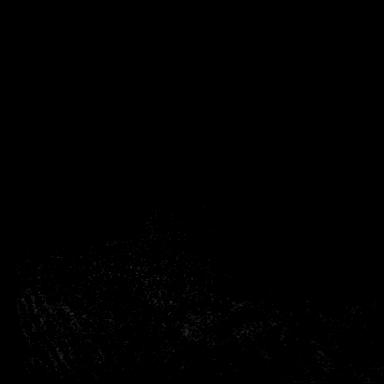

[34 of 48 positions shown; findings below may reference images not displayed]

Three-dimensional MR images were rendered by post-processing of the
original MR data on an independent workstation. The
three-dimensional MR images were interpreted, and findings are
reported in the following complete MRI report for this study. Three
dimensional images were evaluated at the independent [employer]
workstation
FINDINGS: Breast composition: c. Heterogeneous fibroglandular tissue.

Background parenchymal enhancement: Marked.

Right breast: Marked enhancement is noted throughout the superior
and central right breast which likely obscured the linear enhancing
mass seen on the original diagnostic MRI.

Lymph nodes: The prominent right axillary lymph node is again noted.

Ancillary findings:  None.
IMPRESSION: 1. Non visualization of the linear enhancing mass in the right
breast.

2.  Borderline enlarged right axillary lymph node.

RECOMMENDATION:
1. I recommend that the patient be brought back for a repeat attempt
at biopsy in 1-2 weeks at a different time point in her menstrual
cycle, which will hopefully demonstrate decreased overall
enhancement. Given the size of the mass in the original exam, biopsy
of the anterior and posterior aspect is recommended if it is
visualized.

2. As per the original MRI report, if the biopsy is positive for
malignancy, then second-look ultrasound of the right axilla with
possible biopsy is recommended.

BI-RADS CATEGORY  4: Suspicious.

## 2019-06-02 ENCOUNTER — Ambulatory Visit: Payer: BC Managed Care – PPO | Attending: Internal Medicine

## 2019-06-02 DIAGNOSIS — Z20822 Contact with and (suspected) exposure to covid-19: Secondary | ICD-10-CM

## 2019-06-03 LAB — NOVEL CORONAVIRUS, NAA: SARS-CoV-2, NAA: NOT DETECTED

## 2019-06-18 DIAGNOSIS — Z20828 Contact with and (suspected) exposure to other viral communicable diseases: Secondary | ICD-10-CM | POA: Diagnosis not present

## 2019-07-24 ENCOUNTER — Ambulatory Visit: Payer: BC Managed Care – PPO | Attending: Internal Medicine

## 2019-07-24 DIAGNOSIS — Z23 Encounter for immunization: Secondary | ICD-10-CM

## 2019-07-24 NOTE — Progress Notes (Signed)
   Covid-19 Vaccination Clinic  Name:  Laketta Rudel    MRN: [id number] DOB: 08/06/1970  07/24/2019  Ms. Frate was observed post Covid-19 immunization for 15 minutes without incident. She was provided with Vaccine Information Sheet and instruction to access the V-Safe system.   Ms. Neeper was instructed to call 911 with any severe reactions post vaccine: Marland Kitchen Difficulty breathing  . Swelling of face and throat  . A fast heartbeat  . A bad rash all over body  . Dizziness and weakness   Immunizations Administered    Name Date Dose VIS Date Route   Pfizer COVID-19 Vaccine 07/24/2019  3:10 PM 0.3 mL 03/27/2019 Intramuscular   Manufacturer: [place]   Lot: [id number]   [place]: [id number]

## 2019-08-17 ENCOUNTER — Ambulatory Visit: Payer: BC Managed Care – PPO | Attending: Internal Medicine

## 2019-08-17 DIAGNOSIS — Z23 Encounter for immunization: Secondary | ICD-10-CM

## 2019-08-17 NOTE — Progress Notes (Signed)
   Covid-19 Vaccination Clinic  Name:  Sharon Meyer    MRN: [id number] DOB: 01-30-1971  08/17/2019  [name] was observed post Covid-19 immunization for 15 minutes without incident. She was provided with Vaccine Information Sheet and instruction to access the V-Safe system.   [name] was instructed to call 911 with any severe reactions post vaccine: Marland Kitchen Difficulty breathing  . Swelling of face and throat  . A fast heartbeat  . A bad rash all over body  . Dizziness and weakness   Immunizations Administered    Name Date Dose VIS Date Route   Pfizer COVID-19 Vaccine 08/17/2019  3:27 PM 0.3 mL 06/10/2018 Intramuscular   Manufacturer: [place]   Lot: [id number]   [place]: [id number]

## 2019-11-12 ENCOUNTER — Encounter: Payer: Self-pay | Admitting: Internal Medicine

## 2019-11-12 ENCOUNTER — Telehealth (INDEPENDENT_AMBULATORY_CARE_PROVIDER_SITE_OTHER): Payer: BC Managed Care – PPO | Admitting: Internal Medicine

## 2019-11-12 DIAGNOSIS — Z7689 Persons encountering health services in other specified circumstances: Secondary | ICD-10-CM

## 2019-11-12 DIAGNOSIS — D509 Iron deficiency anemia, unspecified: Secondary | ICD-10-CM

## 2019-11-12 DIAGNOSIS — Z1159 Encounter for screening for other viral diseases: Secondary | ICD-10-CM | POA: Diagnosis not present

## 2019-11-12 DIAGNOSIS — Z13228 Encounter for screening for other metabolic disorders: Secondary | ICD-10-CM

## 2019-11-12 DIAGNOSIS — N920 Excessive and frequent menstruation with regular cycle: Secondary | ICD-10-CM | POA: Diagnosis not present

## 2019-11-12 MED ORDER — NORGESTIMATE-ETH ESTRADIOL 0.25-35 MG-MCG PO TABS: 1.0000 | ORAL_TABLET | Freq: Every day | ORAL | 11 refills | Status: DC

## 2019-11-12 MED ORDER — FERROUS SULFATE 324 (65 FE) MG PO TBEC: 1.0000 | DELAYED_RELEASE_TABLET | Freq: Every day | ORAL | 1 refills | Status: DC

## 2019-11-12 NOTE — Addendum Note (Signed)
Addended by: Melina Schools on: 11/12/2019 03:51 PM   Modules accepted: Orders

## 2019-11-12 NOTE — Progress Notes (Addendum)
Virtual Visit via Telephone Note  I connected with Sharon Meyer, on 11/12/2019 at 3:34 PM by telephone due to the COVID-19 pandemic and verified that I am speaking with the correct person using two identifiers.   Consent: I discussed the limitations, risks, security and privacy concerns of performing an evaluation and management service by telephone and the availability of in person appointments. I also discussed with the patient that there may be a patient responsible charge related to this service. The patient expressed understanding and agreed to proceed.   Location of Patient: Home   Location of Provider: Home    Persons participating in Telemedicine visit: Sharon Meyer Dr. Juleen China      History of Present Illness: Patient has a visit to establish care. Patient reports history of IDA related to fibroid uterus and menorrhagia. She was hospitalized in March 2020 for symptomatic anemia. She has not been taking Fe supplements for the past 4-5 months since she ran out of Rx. She is experiencing occasional lightheadedness and dizziness. Denies syncopal or near syncopal episodes. No chest pain or SOB. During the hospitalization, she was started on OCPs in attempt to decrease menstrual cycle blood flow. She no longer has an Rx for this. Reports that menses are regular. First 3 days are typically very heavy. Menses lasts for 7 days on average.      Past Medical History:  Diagnosis Date  . Cancer [facility])    Right breast cancer  . IDA (iron deficiency anemia)   . PONV (postoperative nausea and vomiting)    No Known Allergies  No current outpatient medications on file prior to visit.   No current facility-administered medications on file prior to visit.    Observations/Objective: NAD. Speaking clearly.  Work of breathing normal.  Alert and oriented. Mood appropriate.   Assessment and Plan: 1. Encounter to establish care Reviewed  patient's PMH, social history, surgical history, and medications.  Is overdue for annual exam, screening blood work, and health maintenance topics. Have asked patient to return for visit to address these items.   2. Iron deficiency anemia, unspecified iron deficiency anemia type Reviewed that last HgB checked was in March 2020 with result of 7.3. Repeat labs to direct therapy. Resume Fe supplementation. Will address heavy menses as below. Patient is mildly symptomatic but stable. Advised that if symptoms became more severe, experienced syncope, had chest pain, or trouble breathing should seek emergency care.  - CBC with Differential; Future - Iron, TIBC and Ferritin Panel; Future - ferrous sulfate 324 (65 Fe) MG TBEC; Take 1 tablet (325 mg total) by mouth daily.  Dispense: 90 tablet; Refill: 1  3. Menorrhagia with regular cycle Will try to decrease heavy menstrual bleeding that is the likely etiology of her IDA by starting OCPs. Not current smoker. No history of migraine with aura, HTN, or VTE.  - norgestimate-ethinyl estradiol (SPRINTEC 28) 0.25-35 MG-MCG tablet; Take 1 tablet by mouth daily.  Dispense: 28 tablet; Refill: 11 - ferrous sulfate 324 (65 Fe) MG TBEC; Take 1 tablet (325 mg total) by mouth daily.  Dispense: 90 tablet; Refill: 1  4. Need for hepatitis C screening test - Hepatitis c antibody (reflex); Future  5. Screening for metabolic disorder Will obtain screening labs in anticipation of upcoming annual exam.  - Comprehensive metabolic panel; Future - CBC; Future - Lipid panel; Future    Follow Up Instructions: Lab visit 8/2, needs annual exam scheduled    I discussed the assessment and  treatment plan with the patient. The patient was provided an opportunity to ask questions and all were answered. The patient agreed with the plan and demonstrated an understanding of the instructions.   The patient was advised to call back or seek an in-person evaluation if the symptoms  worsen or if the condition fails to improve as anticipated.     I provided 18 minutes total of non-face-to-face time during this encounter including median intraservice time, reviewing previous notes, investigations, ordering medications, medical decision making, coordinating care and patient verbalized understanding at the end of the visit.    Phill Myron, D.O. Primary Care at [facility]  11/12/2019, 3:34 PM

## 2019-11-12 NOTE — Patient Instructions (Signed)
Thank you for choosing Primary Care at [facility] to be your medical home!    Sharon Meyer was seen by Melina Schools, DO today.   Sharon Meyer's primary care provider is Phill Myron, DO.   For the best care possible, you should try to see Phill Myron, DO whenever you come to the clinic.   We look forward to seeing you again soon!  If you have any questions about your visit today, please call us at [phone] or feel free to reach your primary care provider via [place].

## 2019-11-16 ENCOUNTER — Other Ambulatory Visit (INDEPENDENT_AMBULATORY_CARE_PROVIDER_SITE_OTHER): Payer: BC Managed Care – PPO

## 2019-11-16 ENCOUNTER — Other Ambulatory Visit: Payer: Self-pay

## 2019-11-16 DIAGNOSIS — Z13228 Encounter for screening for other metabolic disorders: Secondary | ICD-10-CM

## 2019-11-16 DIAGNOSIS — D509 Iron deficiency anemia, unspecified: Secondary | ICD-10-CM | POA: Diagnosis not present

## 2019-11-16 DIAGNOSIS — Z1159 Encounter for screening for other viral diseases: Secondary | ICD-10-CM | POA: Diagnosis not present

## 2019-11-17 ENCOUNTER — Telehealth: Payer: Self-pay | Admitting: Internal Medicine

## 2019-11-17 ENCOUNTER — Other Ambulatory Visit: Payer: Self-pay

## 2019-11-17 ENCOUNTER — Encounter (HOSPITAL_COMMUNITY): Payer: Self-pay | Admitting: Emergency Medicine

## 2019-11-17 ENCOUNTER — Inpatient Hospital Stay (HOSPITAL_COMMUNITY)
Admission: EM | Admit: 2019-11-17 | Discharge: 2019-11-19 | DRG: 761 | Disposition: A | Discharge status: Home / Self Care | Payer: BC Managed Care – PPO | Source: Ambulatory Visit | Attending: Internal Medicine | Admitting: Internal Medicine

## 2019-11-17 DIAGNOSIS — D500 Iron deficiency anemia secondary to blood loss (chronic): Secondary | ICD-10-CM | POA: Diagnosis present

## 2019-11-17 DIAGNOSIS — Z20822 Contact with and (suspected) exposure to covid-19: Secondary | ICD-10-CM | POA: Diagnosis present

## 2019-11-17 DIAGNOSIS — D259 Leiomyoma of uterus, unspecified: Secondary | ICD-10-CM | POA: Diagnosis not present

## 2019-11-17 DIAGNOSIS — N920 Excessive and frequent menstruation with regular cycle: Secondary | ICD-10-CM | POA: Diagnosis present

## 2019-11-17 DIAGNOSIS — Z853 Personal history of malignant neoplasm of breast: Secondary | ICD-10-CM

## 2019-11-17 DIAGNOSIS — D649 Anemia, unspecified: Secondary | ICD-10-CM | POA: Diagnosis present

## 2019-11-17 LAB — IRON,TIBC AND FERRITIN PANEL
Ferritin: 2 ng/mL — ABNORMAL LOW (ref 15–150)
Iron Saturation: 4 % — CL (ref 15–55)
Iron: 18 ug/dL — ABNORMAL LOW (ref 27–159)
Total Iron Binding Capacity: 445 ug/dL (ref 250–450)
UIBC: 427 ug/dL — ABNORMAL HIGH (ref 131–425)

## 2019-11-17 LAB — COMPREHENSIVE METABOLIC PANEL
ALT: 7 IU/L (ref 0–32)
ALT: 11 U/L (ref 0–44)
AST: 9 IU/L (ref 0–40)
AST: 13 U/L — ABNORMAL LOW (ref 15–41)
Albumin/Globulin Ratio: 1.2 (ref 1.2–2.2)
Albumin: 4.1 g/dL (ref 3.8–4.8)
Albumin: 3.6 g/dL (ref 3.5–5.0)
Alkaline Phosphatase: 64 IU/L (ref 48–121)
Alkaline Phosphatase: 51 U/L (ref 38–126)
Anion gap: 7 (ref 5–15)
BUN/Creatinine Ratio: 13 (ref 9–23)
BUN: 7 mg/dL (ref 6–24)
BUN: 8 mg/dL (ref 6–20)
Bilirubin Total: <0.2 mg/dL (ref 0.0–1.2)
CO2: 19 mmol/L — ABNORMAL LOW (ref 20–29)
CO2: 23 mmol/L (ref 22–32)
Calcium: 9 mg/dL (ref 8.7–10.2)
Calcium: 9 mg/dL (ref 8.9–10.3)
Chloride: 105 mmol/L (ref 96–106)
Chloride: 105 mmol/L (ref 98–111)
Creatinine, Ser: 0.56 mg/dL — ABNORMAL LOW (ref 0.57–1.00)
Creatinine, Ser: 0.49 mg/dL (ref 0.44–1.00)
GFR calc Af Amer: 127 mL/min/{1.73_m2} (ref >59)
GFR calc Af Amer: >60 mL/min (ref >60)
GFR calc non Af Amer: 110 mL/min/{1.73_m2} (ref >59)
GFR calc non Af Amer: >60 mL/min (ref >60)
Globulin, Total: 3.3 g/dL (ref 1.5–4.5)
Glucose, Bld: 91 mg/dL (ref 70–99)
Glucose: 85 mg/dL (ref 65–99)
Potassium: 4.2 mmol/L (ref 3.5–5.2)
Potassium: 3.5 mmol/L (ref 3.5–5.1)
Sodium: 136 mmol/L (ref 134–144)
Sodium: 135 mmol/L (ref 135–145)
Total Bilirubin: 0.5 mg/dL (ref 0.3–1.2)
Total Protein: 7.4 g/dL (ref 6.0–8.5)
Total Protein: 7.7 g/dL (ref 6.5–8.1)

## 2019-11-17 LAB — CBC WITH DIFFERENTIAL/PLATELET
Abs Immature Granulocytes: 0 10*3/uL (ref 0.00–0.07)
Basophils Absolute: 0.1 10*3/uL (ref 0.0–0.2)
Basophils Absolute: 0 10*3/uL (ref 0.0–0.1)
Basophils Relative: 0 %
Basos: 1 %
EOS (ABSOLUTE): 0.2 10*3/uL (ref 0.0–0.4)
Eos: 2 %
Eosinophils Absolute: 0.1 10*3/uL (ref 0.0–0.5)
Eosinophils Relative: 2 %
HCT: 16.5 % — ABNORMAL LOW (ref 36.0–46.0)
Hematocrit: 19.6 % — ABNORMAL LOW (ref 34.0–46.6)
Hemoglobin: 4.7 g/dL — CL (ref 11.1–15.9)
Hemoglobin: 4.1 g/dL — CL (ref 12.0–15.0)
Immature Grans (Abs): 0 10*3/uL (ref 0.0–0.1)
Immature Granulocytes: 1 %
Lymphocytes Absolute: 1.7 10*3/uL (ref 0.7–3.1)
Lymphocytes Relative: 22 %
Lymphs Abs: 1.4 10*3/uL (ref 0.7–4.0)
Lymphs: 28 %
MCH: 14.7 pg — ABNORMAL LOW (ref 26.6–33.0)
MCH: 15 pg — ABNORMAL LOW (ref 26.0–34.0)
MCHC: 24 g/dL — CL (ref 31.5–35.7)
MCHC: 24.8 g/dL — ABNORMAL LOW (ref 30.0–36.0)
MCV: 61 fL — ABNORMAL LOW (ref 79–97)
MCV: 60.4 fL — ABNORMAL LOW (ref 80.0–100.0)
Monocytes Absolute: 0.5 10*3/uL (ref 0.1–0.9)
Monocytes Absolute: 0.1 10*3/uL (ref 0.1–1.0)
Monocytes Relative: 1 %
Monocytes: 8 %
Neutro Abs: 4.7 10*3/uL (ref 1.7–7.7)
Neutrophils Absolute: 3.8 10*3/uL (ref 1.4–7.0)
Neutrophils Relative %: 75 %
Neutrophils: 60 %
Platelets: 544 10*3/uL — ABNORMAL HIGH (ref 150–450)
Platelets: 399 10*3/uL (ref 150–400)
RBC: 3.19 x10E6/uL — ABNORMAL LOW (ref 3.77–5.28)
RBC: 2.73 MIL/uL — ABNORMAL LOW (ref 3.87–5.11)
RDW: 26.5 % — ABNORMAL HIGH (ref 11.7–15.4)
RDW: 27.8 % — ABNORMAL HIGH (ref 11.5–15.5)
WBC: 6.3 10*3/uL (ref 3.4–10.8)
WBC: 6.2 10*3/uL (ref 4.0–10.5)
nRBC: 0 % (ref 0.0–0.2)
nRBC: 0 /100 WBC

## 2019-11-17 LAB — LIPID PANEL
Chol/HDL Ratio: 2.9 ratio (ref 0.0–4.4)
Cholesterol, Total: 141 mg/dL (ref 100–199)
HDL: 49 mg/dL (ref >39)
LDL Chol Calc (NIH): 82 mg/dL (ref 0–99)
Triglycerides: 44 mg/dL (ref 0–149)
VLDL Cholesterol Cal: 10 mg/dL (ref 5–40)

## 2019-11-17 LAB — HCV AB W/RFLX TO VERIFICATION: HCV Ab: 0.2 s/co ratio (ref 0.0–0.9)

## 2019-11-17 LAB — SARS CORONAVIRUS 2 BY RT PCR (HOSPITAL ORDER, PERFORMED IN ~~LOC~~ HOSPITAL LAB): SARS Coronavirus 2: NEGATIVE

## 2019-11-17 LAB — HCV INTERPRETATION

## 2019-11-17 LAB — SPECIMEN STATUS REPORT

## 2019-11-17 LAB — PREPARE RBC (CROSSMATCH)

## 2019-11-17 MED ORDER — MEGESTROL ACETATE 40 MG PO TABS
40.0000 mg | ORAL_TABLET | Freq: Two times a day (BID) | ORAL | Status: DC
  Administered 2019-11-18 – 2019-11-19 (×4): 40 mg via ORAL
  Filled 2019-11-17 (×5): qty 1

## 2019-11-17 MED ORDER — SODIUM CHLORIDE 0.9 % IV SOLN
10.0000 mL/h | Freq: Once | INTRAVENOUS | Status: AC
  Administered 2019-11-18: 10 mL/h via INTRAVENOUS

## 2019-11-17 MED ORDER — ACETAMINOPHEN 325 MG PO TABS: 650.0000 mg | ORAL_TABLET | Freq: Four times a day (QID) | ORAL | Status: DC | PRN

## 2019-11-17 MED ORDER — SODIUM CHLORIDE 0.9 % IV SOLN
125.0000 mg | Freq: Once | INTRAVENOUS | Status: AC
  Administered 2019-11-18: 125 mg via INTRAVENOUS
  Filled 2019-11-17: qty 10

## 2019-11-17 MED ORDER — FERROUS SULFATE 325 (65 FE) MG PO TABS
325.0000 mg | ORAL_TABLET | Freq: Every day | ORAL | Status: DC
  Administered 2019-11-18 (×2): 325 mg via ORAL
  Filled 2019-11-17 (×2): qty 1

## 2019-11-17 MED ORDER — ACETAMINOPHEN 650 MG RE SUPP: 650.0000 mg | Freq: Four times a day (QID) | RECTAL | Status: DC | PRN

## 2019-11-17 NOTE — Telephone Encounter (Signed)
FYI

## 2019-11-17 NOTE — Progress Notes (Signed)
Patient notified of results & recommendations. Expressed understanding. Is agreeable to going to ER for transfusion.  Waiting on additional labs to result from [place].

## 2019-11-17 NOTE — H&P (Signed)
History and Physical    Sharon Meyer PNT:[id number] DOB: 12-01-1970 DOA: 11/17/2019  PCP: Nicolette Bang, DO (Confirm with patient/family/NH records and if not entered, this has to be entered at [facility] point of entry) Patient coming from: Home  I have personally briefly reviewed patient's old medical records in [place]  Chief Complaint: Vaginal bleeding  HPI: Sharon Meyer is a 49 y.o. female with medical history significant of chronic iron deficiency anemia, menorrhagia secondary to uterine fibroid, breast benign intraductal papilloma s/p lumpectomy in 2020, presented with abnormal lab.  Patient has chronic anemia and in 2016 pelvic ultrasound showed fibroid likely the culprit for her chronic menorrhagia issue.  No however is unaware of such condition of fibroid.  She was admitted in March of this year for symptomatic anemia which improved after transfusion.  She was started on iron supplement and birth control pill which she is to start next week, after her menstrual completed.  Her menstrual period started yesterday very heavy, she says usually her menstrual will last 5 to 7 days and the first 3 days will be very heavy.  To have a routine blood work yesterday and was told to come to the ED because hemoglobin level was low.  She denied any abdominal pain, no melena no easy bruising.  She has frequent feeling of short of breath when walking for more than 8 to 10 minutes, and feeling tired and sleepy after work, denies any chest pain. ED Course: [place] 4.7.  Review of Systems: As per HPI otherwise 10 point review of systems negative.   Past Medical History:  Diagnosis Date   Cancer ([place])    Right breast cancer   IDA (iron deficiency anemia)    PONV (postoperative nausea and vomiting)     Past Surgical History:  Procedure Laterality Date   BREAST EXCISIONAL BIOPSY Right 08/09/2014   BREAST LUMPECTOMY Right 05/21/2018   Procedure: RIGHT BREAST  LUMPECTOMY;  Surgeon: Excell Seltzer, MD;  Location: [place];  Service: General;  Laterality: Right;   BREAST LUMPECTOMY WITH RADIOACTIVE SEED LOCALIZATION Right 08/09/2014   Procedure: RIGHT BREAST LUMPECTOMY WITH RADIOACTIVE SEED LOCALIZATION;  Surgeon: Excell Seltzer, MD;  Location: [place];  Service: General;  Laterality: Right;   BREAST SURGERY N/A    Phreesia 11/10/2019   btl     HERNIA REPAIR     umbilical hernia     reports that she has never smoked. She has never used smokeless tobacco. She reports that she does not drink alcohol and does not use drugs.  No Known Allergies  Family History  Problem Relation Age of Onset   Healthy Mother    Healthy Father      Prior to Admission medications   Medication Sig Start Date End Date Taking? Authorizing Provider  ferrous sulfate 324 (65 Fe) MG TBEC Take 1 tablet (325 mg total) by mouth daily. Patient taking differently: Take 1 tablet by mouth at bedtime.  11/12/19  Yes Nicolette Bang, DO  norgestimate-ethinyl estradiol (MILI) 0.25-35 MG-MCG tablet Take 1 tablet by mouth daily.    [provider]  norgestimate-ethinyl estradiol ([place] 28) 0.25-35 MG-MCG tablet Take 1 tablet by mouth daily. Patient not taking: Reported on 11/17/2019 11/12/19   Nicolette Bang, DO    Physical Exam: Vitals:   11/17/19 1549 11/17/19 1607 11/17/19 1648  BP: 118/73  111/67  Pulse: 89  84  Resp: 15  16  Temp: 98.4 F (36.9  C)    TempSrc: Oral    SpO2: 100%  98%  Weight:  81.6 kg   Height:  5\' 7"  (1.702 m)     Constitutional: NAD, calm, comfortable Vitals:   11/17/19 1549 11/17/19 1607 11/17/19 1648  BP: 118/73  111/67  Pulse: 89  84  Resp: 15  16  Temp: 98.4 F (36.9 C)    TempSrc: Oral    SpO2: 100%  98%  Weight:  81.6 kg   Height:  5\' 7"  (1.702 m)    Eyes: PERRL, lids and conjunctivae normal ENMT: Mucous membranes are moist. Posterior pharynx clear of any  exudate or lesions.Normal dentition.  Neck: normal, supple, no masses, no thyromegaly Respiratory: clear to auscultation bilaterally, no wheezing, no crackles. Normal respiratory effort. No accessory muscle use.  Cardiovascular: Regular rate and rhythm, no murmurs / rubs / gallops. No extremity edema. 2+ pedal pulses. No carotid bruits.  Abdomen: no tenderness, no masses palpated. No hepatosplenomegaly. Bowel sounds positive.  Musculoskeletal: no clubbing / cyanosis. No joint deformity upper and lower extremities. Good ROM, no contractures. Normal muscle tone.  Skin: no rashes, lesions, ulcers. No induration Neurologic: CN 2-12 grossly intact. Sensation intact, DTR normal. Strength 5/5 in all 4.  Psychiatric: Normal judgment and insight. Alert and oriented x 3. Normal mood.     Labs on Admission: I have personally reviewed following labs and imaging studies  CBC: Recent Labs  Lab 11/16/19 0839 11/17/19 1621  WBC 6.3 6.2  NEUTROABS 3.8 4.7  HGB 4.7* 4.1*  HCT 19.6* 16.5*  MCV 61* 60.4*  PLT 544* 448   Basic Metabolic Panel: Recent Labs  Lab 11/16/19 0839 11/16/19 0847 11/17/19 1621  NA CANCELED 136 135  K CANCELED 4.2 3.5  CL CANCELED 105 105  CO2 CANCELED 19* 23  GLUCOSE CANCELED 85 91  BUN CANCELED 7 8  CREATININE CANCELED 0.56* 0.49  CALCIUM CANCELED 9.0 9.0   GFR: Estimated Creatinine Clearance: 93.5 mL/min (by C-G formula based on SCr of 0.49 mg/dL). Liver Function Tests: Recent Labs  Lab 11/16/19 0839 11/16/19 0847 11/17/19 1621  AST CANCELED 9 13*  ALT CANCELED 7 11  ALKPHOS CANCELED 64 51  BILITOT CANCELED <0.2 0.5  PROT CANCELED 7.4 7.7  ALBUMIN CANCELED 4.1 3.6   No results for input(s): LIPASE, AMYLASE in the last 168 hours. No results for input(s): AMMONIA in the last 168 hours. Coagulation Profile: No results for input(s): INR, PROTIME in the last 168 hours. Cardiac Enzymes: No results for input(s): CKTOTAL, CKMB, CKMBINDEX, TROPONINI in the  last 168 hours. BNP (last 3 results) No results for input(s): PROBNP in the last 8760 hours. HbA1C: No results for input(s): HGBA1C in the last 72 hours. CBG: No results for input(s): GLUCAP in the last 168 hours. Lipid Profile: Recent Labs    11/16/19 0839 11/16/19 0847  CHOL CANCELED 141  HDL CANCELED 49  LDLCALC  --  82  TRIG CANCELED 44  CHOLHDL  --  2.9   Thyroid Function Tests: No results for input(s): TSH, T4TOTAL, FREET4, T3FREE, THYROIDAB in the last 72 hours. Anemia Panel: Recent Labs    11/16/19 0847  FERRITIN 2*  TIBC 445  IRON 18*   Urine analysis:    Component Value Date/Time   LABSPEC 1.020 06/22/2014 1019   PHURINE 7.0 06/22/2014 1019   GLUCOSEU NEGATIVE 06/22/2014 1019   HGBUR NEGATIVE 06/22/2014 1019   BILIRUBINUR NEGATIVE 06/22/2014 1019   KETONESUR NEGATIVE 06/22/2014 1019   PROTEINUR NEGATIVE  06/22/2014 1019   UROBILINOGEN 1.0 06/22/2014 1019   NITRITE NEGATIVE 06/22/2014 1019   LEUKOCYTESUR NEGATIVE 06/22/2014 1019    Radiological Exams on Admission: No results found.  EKG: Independently reviewed.  Sinus rhythm with no acute ST-T changes  Assessment/Plan Active Problems:   Anemia  (please populate well all problems here in Problem List. (For example, if patient is on BP meds at home and you resume or decide to hold them, it is a problem that needs to be her. Same for CAD, COPD, HLD and so on)  Symptomatic anemia secondary to menorrhagia and uterine fibroids, iron deficiency and chronic blood loss -PRBC x2 tonight -Venofer x1 tomorrow -Discussed with on-call OB/GYN Dr. Alwyn Pea, recommand start Megace instead of birth control pills for fast bleeding control.  [facility] to establish first visit.  Probably better for the patient to have hysterectomy, but defer the conversation to OB -Continue iron pill supplement.  DVT prophylaxis: SCD Code Status: Full Family Communication: None at bedside Disposition Plan:  Discharge in a.m. after PRBC and Venofer Consults called: OB/GYN Admission status: MedSurg observation   Lequita Halt MD [facility] Pager [phone]    11/17/2019, 6:42 PM

## 2019-11-17 NOTE — ED Triage Notes (Signed)
Pt st's she had blood drawn at her MD's office and was called and told to come to ED ref. Low hgb of 4.7.   Pt's only c/o is feeling light headed at times

## 2019-11-17 NOTE — ED Provider Notes (Signed)
[facility] EMERGENCY DEPARTMENT Provider Note   CSN: [id number] Arrival date & time: 11/17/19  1524   History Chief Complaint  Patient presents with  . Abnormal Lab    Sharon Meyer is a 49 y.o. female.  The history is provided by the patient.  Abnormal Lab She has history of breast cancer, iron deficiency anemia secondary to menstrual blood loss and was told to come in because her hemoglobin was low when checking outpatient labs.  Hemoglobin is reported to have been 4.7.  She was seen by her primary care provider on 7/29 and given a prescription for oral contraceptives to try to decrease the menstrual blood loss, but she has not started them yet.  She denies dyspnea and denies chest pain.  She denies nausea or vomiting.  Past Medical History:  Diagnosis Date  . Cancer [facility])    Right breast cancer  . IDA (iron deficiency anemia)   . PONV (postoperative nausea and vomiting)     Patient Active Problem List   Diagnosis Date Noted  . Iron deficiency anemia 07/11/2018  . Abnormality of breast on screening mammography 01/30/2018  . 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 BREAST EXCISIONAL BIOPSY Right 08/09/2014  . BREAST LUMPECTOMY Right 05/21/2018   Procedure: RIGHT BREAST LUMPECTOMY;  Surgeon: Excell Seltzer, MD;  Location: [place];  Service: General;  Laterality: Right;  . BREAST LUMPECTOMY WITH RADIOACTIVE SEED LOCALIZATION Right 08/09/2014   Procedure: RIGHT BREAST LUMPECTOMY WITH RADIOACTIVE SEED LOCALIZATION;  Surgeon: Excell Seltzer, MD;  Location: [place];  Service: General;  Laterality: Right;  . BREAST SURGERY N/A    Phreesia 11/10/2019  . btl    . HERNIA REPAIR     umbilical hernia     OB History   No obstetric history on file.     Family History  Problem Relation Age of Onset  . Healthy Mother   . Healthy Father     Social  History   Tobacco Use  . Smoking status: Never Smoker  . Smokeless tobacco: Never Used  Vaping Use  . Vaping Use: Never used  Substance Use Topics  . Alcohol use: No  . Drug use: No    Home Medications Prior to Admission medications   Medication Sig Start Date End Date Taking? Authorizing Provider  ferrous sulfate 324 (65 Fe) MG TBEC Take 1 tablet (325 mg total) by mouth daily. Patient taking differently: Take 1 tablet by mouth at bedtime.  11/12/19  Yes Nicolette Bang, DO  norgestimate-ethinyl estradiol (MILI) 0.25-35 MG-MCG tablet Take 1 tablet by mouth daily.    [provider]  norgestimate-ethinyl estradiol ([place] 28) 0.25-35 MG-MCG tablet Take 1 tablet by mouth daily. Patient not taking: Reported on 11/17/2019 11/12/19   Nicolette Bang, DO    Allergies    Patient has no known allergies.  Review of Systems   Review of Systems  All other systems reviewed and are negative.   Physical Exam Updated Vital Signs BP 111/67 (BP Location: Right Arm)   Pulse 84   Temp 98.4 F (36.9 C) (Oral)   Resp 16   Ht 5\' 7"  (1.702 m)   Wt 81.6 kg   LMP 11/13/2019 (Approximate)   SpO2 98%   BMI 28.19 kg/m   Physical Exam Vitals and nursing note reviewed.   49 year old female, resting comfortably and in no acute distress.  Vital signs are normal. Oxygen saturation is 98%, which is normal. Head is normocephalic and atraumatic. PERRLA, EOMI. Oropharynx is clear.  Conjunctivae are pale. Neck is nontender and supple without adenopathy or JVD. Back is nontender and there is no CVA tenderness. Lungs are clear without rales, wheezes, or rhonchi. Chest is nontender. Heart has regular rate and rhythm without murmur. Abdomen is soft, flat, nontender without masses or hepatosplenomegaly and peristalsis is normoactive. Extremities have no cyanosis or edema, full range of motion is present. Skin is warm and dry without rash. Neurologic: Mental status is normal,  cranial nerves are intact, there are no motor or sensory deficits.  ED Results / Procedures / Treatments   Labs (all labs ordered are listed, but only abnormal results are displayed) Labs Reviewed  CBC WITH DIFFERENTIAL/PLATELET - Abnormal; Notable for the following components:      Result Value   RBC 2.73 (*)    Hemoglobin 4.1 (*)    HCT 16.5 (*)    MCV 60.4 (*)    MCH 15.0 (*)    MCHC 24.8 (*)    RDW 27.8 (*)    All other components within normal limits  COMPREHENSIVE METABOLIC PANEL - Abnormal; Notable for the following components:   AST 13 (*)    All other components within normal limits  SARS CORONAVIRUS 2 BY RT PCR (HOSPITAL ORDER, [facility])  TYPE AND SCREEN  PREPARE RBC (CROSSMATCH)    EKG EKG Interpretation  Date/Time:  Tuesday November 17 2019 17:47:21 EDT Ventricular Rate:  91 PR Interval:    QRS Duration: 120 QT Interval:  387 QTC Calculation: 477 R Axis:   65 Text Interpretation: Sinus rhythm Nonspecific intraventricular conduction delay When compared with ECG of 07/10/2018, No significant change was found Confirmed by Delora Fuel ([id number]) on 11/17/2019 5:49:27 PM  Procedures Procedures  CRITICAL CARE Performed by: Delora Fuel Total critical care time: 35 minutes Critical care time was exclusive of separately billable procedures and treating other patients. Critical care was necessary to treat or prevent imminent or life-threatening deterioration. Critical care was time spent personally by me on the following activities: development of treatment plan with patient and/or surrogate as well as nursing, discussions with consultants, evaluation of patient's response to treatment, examination of patient, obtaining history from patient or surrogate, ordering and performing treatments and interventions, ordering and review of laboratory studies, ordering and review of radiographic studies, pulse oximetry and re-evaluation of patient's  condition.  Medications Ordered in ED Medications  0.9 %  sodium chloride infusion (has no administration in time range)    ED Course  I have reviewed the triage vital signs and the nursing notes.  Pertinent labs & imaging results that were available during my care of the patient were reviewed by me and considered in my medical decision making (see chart for details).  MDM Rules/Calculators/A&P Severe anemia diagnosis iron deficiency anemia in the past, probably secondary to menstrual blood loss.  Hemoglobin today is 4.1 with severely microcytic RBC indices.  She will need to be admitted and given 2 units of blood via transfusion.  Old records are reviewed confirming recent outpatient evaluation, prior hospital admission for symptomatic anemia.  Case is discussed with Dr. Pietro Cassis of [facility], who agrees to admit the patient.  Final Clinical Impression(s) / ED Diagnoses Final diagnoses:  Symptomatic anemia  Menorrhagia with regular cycle    Rx / DC Orders ED Discharge Orders    None  Delora Fuel, MD 64/40/34 [phone]

## 2019-11-17 NOTE — Telephone Encounter (Addendum)
I'm currently on the phone with [facility] to see why other orders were cancelled.  Yolani([facility] rep) was not able to give a clear answer as to why the lipid, CMP, Iron, TIBC, Ferritin panel were cancelled. She states that they were entered into the system using a different specimen [phone]). They are currently running & should be crossing over today.

## 2019-11-17 NOTE — Telephone Encounter (Signed)
Rep called in from [facility] to report a critical lab for the patients hemoglobin. The patients hemoglobin is currently 4.7.

## 2019-11-18 ENCOUNTER — Encounter: Payer: Self-pay | Admitting: Obstetrics and Gynecology

## 2019-11-18 ENCOUNTER — Other Ambulatory Visit: Payer: Self-pay

## 2019-11-18 DIAGNOSIS — N920 Excessive and frequent menstruation with regular cycle: Secondary | ICD-10-CM | POA: Diagnosis present

## 2019-11-18 DIAGNOSIS — D500 Iron deficiency anemia secondary to blood loss (chronic): Secondary | ICD-10-CM

## 2019-11-18 DIAGNOSIS — Z853 Personal history of malignant neoplasm of breast: Secondary | ICD-10-CM | POA: Diagnosis not present

## 2019-11-18 DIAGNOSIS — Z20822 Contact with and (suspected) exposure to covid-19: Secondary | ICD-10-CM | POA: Diagnosis present

## 2019-11-18 DIAGNOSIS — D649 Anemia, unspecified: Secondary | ICD-10-CM | POA: Diagnosis present

## 2019-11-18 DIAGNOSIS — D259 Leiomyoma of uterus, unspecified: Secondary | ICD-10-CM | POA: Diagnosis present

## 2019-11-18 LAB — HEMOGLOBIN AND HEMATOCRIT, BLOOD
HCT: 30.1 % — ABNORMAL LOW (ref 36.0–46.0)
HCT: 28.2 % — ABNORMAL LOW (ref 36.0–46.0)
Hemoglobin: 8.8 g/dL — ABNORMAL LOW (ref 12.0–15.0)
Hemoglobin: 8.3 g/dL — ABNORMAL LOW (ref 12.0–15.0)

## 2019-11-18 LAB — CBC
HCT: 22.8 % — ABNORMAL LOW (ref 36.0–46.0)
Hemoglobin: 6.3 g/dL — CL (ref 12.0–15.0)
MCH: 18.7 pg — ABNORMAL LOW (ref 26.0–34.0)
MCHC: 27.6 g/dL — ABNORMAL LOW (ref 30.0–36.0)
MCV: 67.7 fL — ABNORMAL LOW (ref 80.0–100.0)
Platelets: 283 10*3/uL (ref 150–400)
RBC: 3.37 MIL/uL — ABNORMAL LOW (ref 3.87–5.11)
RDW: 32.3 % — ABNORMAL HIGH (ref 11.5–15.5)
WBC: 6 10*3/uL (ref 4.0–10.5)
nRBC: 0 % (ref 0.0–0.2)

## 2019-11-18 LAB — PREPARE RBC (CROSSMATCH)

## 2019-11-18 LAB — HIV ANTIBODY (ROUTINE TESTING W REFLEX): HIV Screen 4th Generation wRfx: NONREACTIVE

## 2019-11-18 MED ORDER — SODIUM CHLORIDE 0.9% IV SOLUTION: Freq: Once | INTRAVENOUS | Status: AC

## 2019-11-18 NOTE — Progress Notes (Signed)
PROGRESS NOTE    Tayla Panozzo Dieter  TKZ:[id number] DOB: [birth year] DOA: 11/17/2019 PCP: Nicolette Bang, DO    Brief Narrative:  Sharon Meyer is a 49 y.o. female with medical history significant of chronic iron deficiency anemia, menorrhagia secondary to uterine fibroid, breast benign intraductal papilloma s/p lumpectomy in 2020, presented with abnormal lab.  Patient has chronic anemia and in 2016 pelvic ultrasound showed fibroid likely the culprit for her chronic menorrhagia issue.  No however is unaware of such condition of fibroid.  She was admitted in March of this year for symptomatic anemia which improved after transfusion.  She was started on iron supplement and birth control pill which she is to start next week, after her menstrual completed.  Her menstrual period started yesterday very heavy, she says usually her menstrual will last 5 to 7 days and the first 3 days will be very heavy.  To have a routine blood work yesterday and was told to come to the ED because hemoglobin level was low.  She denied any abdominal pain, no melena no easy bruising.  She has frequent feeling of short of breath when walking for more than 8 to 10 minutes, and feeling tired and sleepy after work, denies any chest pain.  In the ED, hemoglobin was noted to be 4.7.  [facility] was consulted for admission for further evaluation and treatment of severe symptomatic anemia.   Assessment & Plan:   Active Problems:   Symptomatic anemia   Anemia  Severe symptomatic anemia secondary to menorrhagia/chronic blood loss History of uterine fibroids Iron deficiency anemia Patient presenting to the ED with abnormal lab work with low hemoglobin.  Patient was noted to have a hemoglobin of 4.1.  Etiology likely related to her severe menorrhagia with heavy menses and underlying uterine fibroids.  Iron level 18, TIBC 445, ferritin 2.  Case was discussed with on-call OB/GYN, Dr. Alwyn Pea who recommended to start  Megace and lieu of birth control pills for bleeding control and women Center will call patient to establish outpatient visit. --Transfused 2 unit PRBC and IV iron on 8/4; hemoglobin up to 6.6 this morning --Transfuse 2 additional units of PRBCs today --Continue Megace 40 mg p.o. twice daily --Continue oral iron supplementation --Will need close outpatient follow-up with GYN for consideration of hysterectomy   DVT prophylaxis: SCDs Code Status: Full code Family Communication: None present at bedside this morning  Disposition Plan:  Status is: Inpatient  Remains inpatient appropriate because:Hemodynamically unstable and IV treatments appropriate due to intensity of illness or inability to take PO   Dispo: The patient is from: Home              Anticipated d/c is to: Home              Anticipated d/c date is: 1 day              Patient currently is not medically stable to d/c.   Consultants:   OB/GYN - Case discussed by admitting physician with Dr. Alwyn Pea with recommendations of starting Megace, transfusion and outpatient follow-up  Procedures:   None  Antimicrobials:   None   Subjective: Patient seen and examined bedside, resting comfortably.  Continues with anemia, although hemoglobin improved from last 4.1-6.3 this morning.  Receiving 2 additional units of hemoglobin today.  No other complaints or concerns at this time.  Denies headache, no fever/chills/night sweats, no nausea/vomiting/diarrhea, no chest pain, palpitations, shortness of breath, no abdominal pain.  No acute  events overnight per nursing staff.  Objective: Vitals:   11/18/19 0806 11/18/19 1103 11/18/19 1105 11/18/19 1148  BP: 104/62 115/80 115/80 114/61  Pulse: 73 65 65 (!) 58  Resp: 18 18 18 18   Temp: 98.3 F (36.8 C) 98.8 F (37.1 C) 98.8 F (37.1 C) 97.8 F (36.6 C)  TempSrc: Oral Oral Oral Axillary  SpO2: 98% 98% 98% 100%  Weight:      Height:        Intake/Output Summary (Last 24 hours) at  11/18/2019 1424 Last data filed at 11/18/2019 1103 Gross per 24 hour  Intake 315 ml  Output --  Net 315 ml   Filed Weights   11/17/19 1607  Weight: 81.6 kg    Examination:  General exam: Appears calm and comfortable  Respiratory system: Clear to auscultation. Respiratory effort normal. Cardiovascular system: S1 & S2 heard, RRR. No JVD, murmurs, rubs, gallops or clicks. No pedal edema. Gastrointestinal system: Abdomen is nondistended, soft and nontender. No organomegaly or masses felt. Normal bowel sounds heard. Central nervous system: Alert and oriented. No focal neurological deficits. Extremities: Symmetric 5 x 5 power. Skin: No rashes, lesions or ulcers Psychiatry: Judgement and insight appear normal. Mood & affect appropriate.     Data Reviewed: I have personally reviewed following labs and imaging studies  CBC: Recent Labs  Lab 11/16/19 0839 11/17/19 1621 11/18/19 0513  WBC 6.3 6.2 6.0  NEUTROABS 3.8 4.7  --   HGB 4.7* 4.1* 6.3*  HCT 19.6* 16.5* 22.8*  MCV 61* 60.4* 67.7*  PLT 544* 399 161   Basic Metabolic Panel: Recent Labs  Lab 11/16/19 0839 11/16/19 0847 11/17/19 1621  NA CANCELED 136 135  K CANCELED 4.2 3.5  CL CANCELED 105 105  CO2 CANCELED 19* 23  GLUCOSE CANCELED 85 91  BUN CANCELED 7 8  CREATININE CANCELED 0.56* 0.49  CALCIUM CANCELED 9.0 9.0   GFR: Estimated Creatinine Clearance: 93.5 mL/min (by C-G formula based on SCr of 0.49 mg/dL). Liver Function Tests: Recent Labs  Lab 11/16/19 0839 11/16/19 0847 11/17/19 1621  AST CANCELED 9 13*  ALT CANCELED 7 11  ALKPHOS CANCELED 64 51  BILITOT CANCELED <0.2 0.5  PROT CANCELED 7.4 7.7  ALBUMIN CANCELED 4.1 3.6   No results for input(s): LIPASE, AMYLASE in the last 168 hours. No results for input(s): AMMONIA in the last 168 hours. Coagulation Profile: No results for input(s): INR, PROTIME in the last 168 hours. Cardiac Enzymes: No results for input(s): CKTOTAL, CKMB, CKMBINDEX, TROPONINI in  the last 168 hours. BNP (last 3 results) No results for input(s): PROBNP in the last 8760 hours. HbA1C: No results for input(s): HGBA1C in the last 72 hours. CBG: No results for input(s): GLUCAP in the last 168 hours. Lipid Profile: Recent Labs    11/16/19 0839 11/16/19 0847  CHOL CANCELED 141  HDL CANCELED 49  LDLCALC  --  82  TRIG CANCELED 44  CHOLHDL  --  2.9   Thyroid Function Tests: No results for input(s): TSH, T4TOTAL, FREET4, T3FREE, THYROIDAB in the last 72 hours. Anemia Panel: Recent Labs    11/16/19 0847  FERRITIN 2*  TIBC 445  IRON 18*   Sepsis Labs: No results for input(s): PROCALCITON, LATICACIDVEN in the last 168 hours.  Recent Results (from the past 240 hour(s))  SARS Coronavirus 2 by RT PCR (hospital order, performed in [facility] hospital lab) Nasopharyngeal Nasopharyngeal Swab     Status: None   Collection Time: 11/17/19  6:51 PM  Specimen: Nasopharyngeal Swab  Result Value Ref Range Status   SARS Coronavirus 2 NEGATIVE NEGATIVE Final    Comment: (NOTE) SARS-CoV-2 target nucleic acids are NOT DETECTED.  The SARS-CoV-2 RNA is generally detectable in upper and lower respiratory specimens during the acute phase of infection. The lowest concentration of SARS-CoV-2 viral copies this assay can detect is 250 copies / mL. A negative result does not preclude SARS-CoV-2 infection and should not be used as the sole basis for treatment or other patient management decisions.  A negative result may occur with improper specimen collection / handling, submission of specimen other than nasopharyngeal swab, presence of viral mutation(s) within the areas targeted by this assay, and inadequate number of viral copies (<250 copies / mL). A negative result must be combined with clinical observations, patient history, and epidemiological information.  Fact Sheet for Patients:   StrictlyIdeas.no  Fact Sheet for Healthcare  Providers: BankingDealers.co.za  This test is not yet approved or  cleared by the [place] FDA and has been authorized for detection and/or diagnosis of SARS-CoV-2 by FDA under an Emergency Use Authorization (EUA).  This EUA will remain in effect (meaning this test can be used) for the duration of the COVID-19 declaration under Section 564(b)(1) of the Act, 21 U.S.C. section 360bbb-3(b)(1), unless the authorization is terminated or revoked sooner.  Performed at [facility], [place] [address], [place], [place] [address]       Radiology Studies: No results found.      Scheduled Meds:  ferrous sulfate  325 mg Oral QHS   megestrol  40 mg Oral BID   Continuous Infusions:  ferric gluconate (FERRLECIT/NULECIT) IV       LOS: 0 days    Time spent: 36 minutes spent on chart review, discussion with nursing staff, consultants, updating family and interview/physical exam; more than 50% of that time was spent in counseling and/or coordination of care.    Mavery Milling J [place] DO [facility] Available via Epic secure chat 7am-7pm After these hours, please refer to coverage provider listed on [email or web address] 11/18/2019, 2:24 PM

## 2019-11-19 DIAGNOSIS — N920 Excessive and frequent menstruation with regular cycle: Secondary | ICD-10-CM | POA: Diagnosis present

## 2019-11-19 LAB — TYPE AND SCREEN
ABO/RH(D): B POS
Antibody Screen: NEGATIVE
Unit division: 0
Unit division: 0
Unit division: 0
Unit division: 0

## 2019-11-19 LAB — BPAM RBC
Blood Product Expiration Date: 202109012359
Blood Product Expiration Date: 202109012359
Blood Product Expiration Date: 202109022359
Blood Product Expiration Date: 202109022359
ISSUE DATE / TIME: 202108032029
ISSUE DATE / TIME: 202108032357
ISSUE DATE / TIME: 202108040745
ISSUE DATE / TIME: 202108041121
Unit Type and Rh: 7300
Unit Type and Rh: 7300
Unit Type and Rh: 7300
Unit Type and Rh: 7300

## 2019-11-19 LAB — CBC
HCT: 29.9 % — ABNORMAL LOW (ref 36.0–46.0)
Hemoglobin: 8.9 g/dL — ABNORMAL LOW (ref 12.0–15.0)
MCH: 21.4 pg — ABNORMAL LOW (ref 26.0–34.0)
MCHC: 29.8 g/dL — ABNORMAL LOW (ref 30.0–36.0)
MCV: 72 fL — ABNORMAL LOW (ref 80.0–100.0)
Platelets: 217 10*3/uL (ref 150–400)
RBC: 4.15 MIL/uL (ref 3.87–5.11)
RDW: 31.1 % — ABNORMAL HIGH (ref 11.5–15.5)
WBC: 6.8 10*3/uL (ref 4.0–10.5)
nRBC: 0.3 % — ABNORMAL HIGH (ref 0.0–0.2)

## 2019-11-19 MED ORDER — MEGESTROL ACETATE 40 MG PO TABS: 40.0000 mg | ORAL_TABLET | Freq: Two times a day (BID) | ORAL | 0 refills | Status: AC

## 2019-11-19 NOTE — Discharge Summary (Signed)
Physician Discharge Summary  Sharon Meyer XIP:[id number] DOB: 02-15-1971 DOA: 11/17/2019  PCP: Nicolette Bang, DO  Admit date: 11/17/2019 Discharge date: 11/19/2019  Admitted From: Home Disposition: Home  Recommendations for Outpatient Follow-up:  1. Follow up with PCP in 1-2 weeks 2. Continue Megace 40 mg p.o. twice daily for menorrhagia as recommended by GYN 3. Patient instructed to make appointment with GYN for menorrhagia for consideration of hysterectomy 4. Please obtain CBC in one week  Home Health: No Equipment/Devices: None  Discharge Condition: Stable CODE STATUS: Full code Diet recommendation: Regular diet  History of present illness:  Sharon Meyer is a 49 y.o.femalewith medical history significant ofchronic iron deficiency anemia, menorrhagia secondary to uterine fibroid,breast benign intraductal papilloma s/p lumpectomy in 2020,presented with abnormal lab. Patient has chronic anemia and in 2016 pelvic ultrasound showed fibroid likely the culprit for her chronic menorrhagia issue.No however is unaware of such condition of fibroid.She was admitted in March of this year for symptomatic anemia which improved after transfusion. She was started on iron supplement and birth control pill which she is to start next week,after her menstrual completed. Her menstrual period started yesterday very heavy, she says usually her menstrual will last 5 to 7 days and the first 3 days will be very heavy. To have a routine blood work yesterday and was told to come to the ED because hemoglobin level was low. She denied any abdominal pain, no melena no easy bruising. She has frequent feeling of short of breath when walking for more than 8 to 10 minutes, and feeling tired and sleepy after work, denies any chest pain.  In the ED, hemoglobin was noted to be 4.7.  TRH was consulted for admission for further evaluation and treatment of severe symptomatic  anemia.  Hospital course:  Severe symptomatic anemia secondary to menorrhagia/chronic blood loss History of uterine fibroids Iron deficiency anemia Patient presenting to the ED with abnormal lab work with low hemoglobin.  Patient was noted to have a hemoglobin of 4.1.  Etiology likely related to her severe menorrhagia with heavy menses and underlying uterine fibroids.  Iron level 18, TIBC 445, ferritin 2.  Case was discussed with on-call OB/GYN, Dr. Alwyn Pea who recommended to start Megace and lieu of birth control pills for bleeding control   Patient was transfused a total of 4 units PRBCs during hospitalization with IV iron on 11/18/2019.  She was started on Megace 40 mg p.o. twice daily.  Hemoglobin remained stable at 8.8 at time of discharge.  Recommend repeat CBC in 1 week.  Patient will need to establish care with GYN for consideration of hysterectomy for ultimate control of her severe menorrhagia.   Discharge Diagnoses:  Active Problems:   Symptomatic anemia   Anemia   Menorrhagia with regular cycle    Discharge Instructions  Discharge Instructions    Call MD for:  difficulty breathing, headache or visual disturbances   Complete by: As directed    Call MD for:  extreme fatigue   Complete by: As directed    Call MD for:  persistant dizziness or light-headedness   Complete by: As directed    Call MD for:  persistant nausea and vomiting   Complete by: As directed    Call MD for:  severe uncontrolled pain   Complete by: As directed    Call MD for:  temperature >100.4   Complete by: As directed    Diet - low sodium heart healthy   Complete by: As directed  Increase activity slowly   Complete by: As directed      Allergies as of 11/19/2019   No Known Allergies     Medication List    STOP taking these medications   Mili 0.25-35 MG-MCG tablet Generic drug: norgestimate-ethinyl estradiol   norgestimate-ethinyl estradiol 0.25-35 MG-MCG tablet Commonly known as: Sprintec 28      TAKE these medications   ferrous sulfate 324 (65 Fe) MG Tbec Take 1 tablet (325 mg total) by mouth daily. What changed: when to take this   megestrol 40 MG tablet Commonly known as: MEGACE Take 1 tablet (40 mg total) by mouth 2 (two) times daily.       Follow-up [facility]. Schedule an appointment as soon as possible for a visit.   Specialty: Obstetrics and Gynecology Contact information: [place] [id number] [phone]       Sharon Kava, MD. Schedule an appointment as soon as possible for a visit in 1 week(s).   Specialty: Obstetrics and Gynecology Contact information: [address] Sharon Meyer [address] [phone]        Nicolette Bang, DO. Schedule an appointment as soon as possible for a visit in 1 week(s).   Specialty: Family Medicine Contact information: [place] [address] [phone]              No Known Allergies  Consultations:  OB/GYN - Case discussed by admitting physician with Dr. Alwyn Pea with recommendations of starting Megace, transfusion and outpatient follow-up   Procedures/Studies:  No results found.   Subjective: Patient seen and examined bedside, resting comfortably.  No complaints.  Her fatigue and weakness have resolved.  Denies any further bleeding, although her normal cycle does not begin until Friday.  Discussed with patient needs to establish care with a GYN and to follow-up with her PCP in 1 week for repeat labs.  Denies headache, no fever/chills/night sweats, no nausea vomiting/diarrhea, no chest pain, no palpitations, no shortness of breath, no abdominal pain, no weakness, fatigue, no paresthesias.  No acute events overnight per nursing staff.  Discharge Exam: Vitals:   11/18/19 2318 11/19/19 0554  BP: 102/70 108/62  Pulse: 73 62  Resp: 18 18  Temp: 98.2 F (36.8 C) 98.3 F  (36.8 C)  SpO2: 100% 98%   Vitals:   11/18/19 1300 11/18/19 1801 11/18/19 2318 11/19/19 0554  BP: 112/70 110/60 102/70 108/62  Pulse: (!) 59 (!) 55 73 62  Resp: 18 16 18 18   Temp: 98 F (36.7 C) 99.2 F (37.3 C) 98.2 F (36.8 C) 98.3 F (36.8 C)  TempSrc: Oral Oral Oral Oral  SpO2:  96% 100% 98%  Weight:      Height:        General: Pt is alert, awake, not in acute distress Cardiovascular: RRR, S1/S2 +, no rubs, no gallops Respiratory: CTA bilaterally, no wheezing, no rhonchi Abdominal: Soft, NT, ND, bowel sounds + Extremities: no edema, no cyanosis    The results of significant diagnostics from this hospitalization (including imaging, microbiology, ancillary and laboratory) are listed below for reference.     Microbiology: Recent Results (from the past 240 hour(s))  SARS Coronavirus 2 by RT PCR (hospital order, performed in [facility] hospital lab) Nasopharyngeal Nasopharyngeal Swab     Status: None   Collection Time: 11/17/19  6:51 PM   Specimen: Nasopharyngeal Swab  Result Value Ref Range  Status   SARS Coronavirus 2 NEGATIVE NEGATIVE Final    Comment: (NOTE) SARS-CoV-2 target nucleic acids are NOT DETECTED.  The SARS-CoV-2 RNA is generally detectable in upper and lower respiratory specimens during the acute phase of infection. The lowest concentration of SARS-CoV-2 viral copies this assay can detect is 250 copies / mL. A negative result does not preclude SARS-CoV-2 infection and should not be used as the sole basis for treatment or other patient management decisions.  A negative result may occur with improper specimen collection / handling, submission of specimen other than nasopharyngeal swab, presence of viral mutation(s) within the areas targeted by this assay, and inadequate number of viral copies (<250 copies / mL). A negative result must be combined with clinical observations, patient history, and epidemiological information.  Fact Sheet for Patients:    StrictlyIdeas.no  Fact Sheet for Healthcare Providers: BankingDealers.co.za  This test is not yet approved or  cleared by the [place] FDA and has been authorized for detection and/or diagnosis of SARS-CoV-2 by FDA under an Emergency Use Authorization (EUA).  This EUA will remain in effect (meaning this test can be used) for the duration of the COVID-19 declaration under Section 564(b)(1) of the Act, 21 U.S.C. section 360bbb-3(b)(1), unless the authorization is terminated or revoked sooner.  Performed at [facility], [place] [address], [place], [place] [address]      Labs: BNP (last 3 results) No results for input(s): BNP in the last 8760 hours. Basic Metabolic Panel: Recent Labs  Lab 11/16/19 0839 11/16/19 0847 11/17/19 1621  NA CANCELED 136 135  K CANCELED 4.2 3.5  CL CANCELED 105 105  CO2 CANCELED 19* 23  GLUCOSE CANCELED 85 91  BUN CANCELED 7 8  CREATININE CANCELED 0.56* 0.49  CALCIUM CANCELED 9.0 9.0   Liver Function Tests: Recent Labs  Lab 11/16/19 0839 11/16/19 0847 11/17/19 1621  AST CANCELED 9 13*  ALT CANCELED 7 11  ALKPHOS CANCELED 64 51  BILITOT CANCELED <0.2 0.5  PROT CANCELED 7.4 7.7  ALBUMIN CANCELED 4.1 3.6   No results for input(s): LIPASE, AMYLASE in the last 168 hours. No results for input(s): AMMONIA in the last 168 hours. CBC: Recent Labs  Lab 11/16/19 0839 11/17/19 1621 11/18/19 0513 11/18/19 1717 11/18/19 2222 11/19/19 0333  WBC 6.3 6.2 6.0  --   --  6.8  NEUTROABS 3.8 4.7  --   --   --   --   HGB 4.7* 4.1* 6.3* 8.8* 8.3* 8.9*  HCT 19.6* 16.5* 22.8* 30.1* 28.2* 29.9*  MCV 61* 60.4* 67.7*  --   --  72.0*  PLT 544* 399 283  --   --  217   Cardiac Enzymes: No results for input(s): CKTOTAL, CKMB, CKMBINDEX, TROPONINI in the last 168 hours. BNP: Invalid input(s): POCBNP CBG: No results for input(s): GLUCAP in the last 168 hours. D-Dimer No results for input(s): DDIMER  in the last 72 hours. Hgb A1c No results for input(s): HGBA1C in the last 72 hours. Lipid Profile No results for input(s): CHOL, HDL, LDLCALC, TRIG, CHOLHDL, LDLDIRECT in the last 72 hours. Thyroid function studies No results for input(s): TSH, T4TOTAL, T3FREE, THYROIDAB in the last 72 hours.  Invalid input(s): FREET3 Anemia work up No results for input(s): VITAMINB12, FOLATE, FERRITIN, TIBC, IRON, RETICCTPCT in the last 72 hours. Urinalysis    Component Value Date/Time   LABSPEC 1.020 06/22/2014 1019   PHURINE 7.0 06/22/2014 1019   GLUCOSEU NEGATIVE 06/22/2014 1019   HGBUR  NEGATIVE 06/22/2014 1019   BILIRUBINUR NEGATIVE 06/22/2014 1019   KETONESUR NEGATIVE 06/22/2014 1019   PROTEINUR NEGATIVE 06/22/2014 1019   UROBILINOGEN 1.0 06/22/2014 1019   NITRITE NEGATIVE 06/22/2014 1019   LEUKOCYTESUR NEGATIVE 06/22/2014 1019   Sepsis Labs Invalid input(s): PROCALCITONIN,  WBC,  LACTICIDVEN Microbiology Recent Results (from the past 240 hour(s))  SARS Coronavirus 2 by RT PCR (hospital order, performed in [facility] hospital lab) Nasopharyngeal Nasopharyngeal Swab     Status: None   Collection Time: 11/17/19  6:51 PM   Specimen: Nasopharyngeal Swab  Result Value Ref Range Status   SARS Coronavirus 2 NEGATIVE NEGATIVE Final    Comment: (NOTE) SARS-CoV-2 target nucleic acids are NOT DETECTED.  The SARS-CoV-2 RNA is generally detectable in upper and lower respiratory specimens during the acute phase of infection. The lowest concentration of SARS-CoV-2 viral copies this assay can detect is 250 copies / mL. A negative result does not preclude SARS-CoV-2 infection and should not be used as the sole basis for treatment or other patient management decisions.  A negative result may occur with improper specimen collection / handling, submission of specimen other than nasopharyngeal swab, presence of viral mutation(s) within the areas targeted by this assay, and inadequate number of viral  copies (<250 copies / mL). A negative result must be combined with clinical observations, patient history, and epidemiological information.  Fact Sheet for Patients:   StrictlyIdeas.no  Fact Sheet for Healthcare Providers: BankingDealers.co.za  This test is not yet approved or  cleared by the [place] FDA and has been authorized for detection and/or diagnosis of SARS-CoV-2 by FDA under an Emergency Use Authorization (EUA).  This EUA will remain in effect (meaning this test can be used) for the duration of the COVID-19 declaration under Section 564(b)(1) of the Act, 21 U.S.C. section 360bbb-3(b)(1), unless the authorization is terminated or revoked sooner.  Performed at [facility], [place] [address], [place], [place] [address]      Time coordinating discharge: Over 30 minutes  SIGNED:   Jeily Guthridge J [place] DO  [facility] 11/19/2019, 9:26 AM

## 2019-11-19 NOTE — Progress Notes (Signed)
Discharge teaching complete. Meds, diet,activity, follow up appointments reviewed and all questions answered. Copy of instructions given to patient and prescription sent to pharmacy.  

## 2019-11-20 ENCOUNTER — Telehealth: Payer: Self-pay

## 2019-11-20 NOTE — Telephone Encounter (Signed)
Transition Care Management Follow-up Telephone Call Date of discharge and from where: 09/03/2019 [facility] How have you been since you were released from the hospital? Feeling much  better  Any questions or concerns? None  Items Reviewed: Did the pt receive and understand the discharge instructions provided? YES Medications obtained and verified? YES  Any new allergies since your discharge? NONE Dietary orders reviewed? Yes  Do you have support at home? [place]   Functional Questionnaire: (I = Independent and D = Dependent) ADLs: I   Follow up appointments reviewed:  PCP Hospital f/u appt confirmed?  Scheduled to see Dr Juleen China on 12/02/2019 at 4:10pm [facility] f/u appt confirmed? OBGYN 12/30/2019  Are transportation arrangements needed? NO  If their condition worsens, /is the pt aware to call PCP or go to the Emergency Dept.?  Pt is aware if condition is worsening or start experiencing any of diff breathing, fevers, SOB, chest pain, extreme fatigue, Persistent nausea and vomiting, bleeding , rapid weight gain, severe uncontrolled pain, or visual disturbances to return to ED  Was the patient provided with contact information for the PCP's office or ED? YES given.  Was to pt encouraged to call back with questions or concerns?YES name and contact information .

## 2019-12-02 ENCOUNTER — Encounter: Payer: Self-pay | Admitting: Internal Medicine

## 2019-12-02 ENCOUNTER — Telehealth (INDEPENDENT_AMBULATORY_CARE_PROVIDER_SITE_OTHER): Payer: BC Managed Care – PPO | Admitting: Internal Medicine

## 2019-12-02 DIAGNOSIS — Z0289 Encounter for other administrative examinations: Secondary | ICD-10-CM

## 2019-12-02 DIAGNOSIS — D649 Anemia, unspecified: Secondary | ICD-10-CM

## 2019-12-02 DIAGNOSIS — Z09 Encounter for follow-up examination after completed treatment for conditions other than malignant neoplasm: Secondary | ICD-10-CM

## 2019-12-02 NOTE — Progress Notes (Addendum)
Virtual Visit via Telephone Note  I connected with Sharon Meyer, on 12/02/2019 at 4:07 PM by telephone due to the COVID-19 pandemic and verified that I am speaking with the correct person using two identifiers.   Consent: I discussed the limitations, risks, security and privacy concerns of performing an evaluation and management service by telephone and the availability of in person appointments. I also discussed with the patient that there may be a patient responsible charge related to this service. The patient expressed understanding and agreed to proceed.   Location of Patient: Home   Location of Provider: Clinic    Persons participating in Telemedicine visit: Sharon Meyer Dr. Juleen China      History of Present Illness: Patient has a visit for hospital follow up. Was hospitalized from 8/3 to 8/5 for symptomatic anemia due to menorrhagia and chronic blood loss. She reports that she is feeling better and more like her usual self since discharge. She is currently taking Megace that was prescribed at discharge. Has not had any bleeding since discharge. No syncope, lightheadedness, chest pain, SOB. Has follow up with GYN with next week to discuss hysterectomy for management of her symptoms. She is currently taking an Fe supplement daily.   Severe symptomatic anemia secondary to menorrhagia/chronic blood loss History of uterine fibroids Iron deficiency anemia Patient presenting to the ED with abnormal lab work with low hemoglobin. Patient was noted to have a hemoglobin of 4.1. Etiology likely related to her severe menorrhagia with heavy menses and underlying uterine fibroids. Iron level 18, TIBC 445, ferritin 2. Case was discussed with on-call OB/GYN, Dr. Alwyn Pea who recommendedto start Megace and lieu of birth control pills for bleeding control   Patient was transfused a total of 4 units PRBCs during hospitalization with IV iron on 11/18/2019.  She  was started on Megace 40 mg p.o. twice daily.  Hemoglobin remained stable at 8.8 at time of discharge.  Recommend repeat CBC in 1 week.  Patient will need to establish care with GYN for consideration of hysterectomy for ultimate control of her severe menorrhagia.   Past Medical History:  Diagnosis Date  . Cancer [facility])    Right breast cancer  . IDA (iron deficiency anemia)   . PONV (postoperative nausea and vomiting)    No Known Allergies  Current Outpatient Medications on File Prior to Visit  Medication Sig Dispense Refill  . ferrous sulfate 324 (65 Fe) MG TBEC Take 1 tablet (325 mg total) by mouth daily. (Patient taking differently: Take 1 tablet by mouth at bedtime. ) 90 tablet 1  . megestrol (MEGACE) 40 MG tablet Take 1 tablet (40 mg total) by mouth 2 (two) times daily. 120 tablet 0   No current facility-administered medications on file prior to visit.    Observations/Objective: NAD. Speaking clearly.  Work of breathing normal.  Alert and oriented. Mood appropriate.   Assessment and Plan: 1. Hospital discharge follow-up  2. Symptomatic anemia HgB 8.9 at discharge. Will repeat CBC. Can likely recommend decreasing Fe to every other day. Follow up with GYN. Return precautions discussed.  - CBC; Future  3. Encounter for completion of form with patient BC[employer] care plan filled out for patient and faxed to insurance company.     Follow Up Instructions: Lab visit 8/19    I discussed the assessment and treatment plan with the patient. The patient was provided an opportunity to ask questions and all were answered. The patient agreed with the plan and demonstrated  an understanding of the instructions.   The patient was advised to call back or seek an in-person evaluation if the symptoms worsen or if the condition fails to improve as anticipated.     I provided 10 minutes total of non-face-to-face time during this encounter including median intraservice time, reviewing previous  notes, investigations, ordering medications, medical decision making, coordinating care and patient verbalized understanding at the end of the visit.    Phill Myron, D.O. Primary Care at [facility]  12/02/2019, 4:07 PM

## 2019-12-02 NOTE — Addendum Note (Signed)
Addended by: Melina Schools on: 12/02/2019 04:35 PM   Modules accepted: Level of Service

## 2019-12-03 ENCOUNTER — Other Ambulatory Visit (INDEPENDENT_AMBULATORY_CARE_PROVIDER_SITE_OTHER): Payer: BC Managed Care – PPO

## 2019-12-03 ENCOUNTER — Other Ambulatory Visit: Payer: Self-pay

## 2019-12-03 DIAGNOSIS — D649 Anemia, unspecified: Secondary | ICD-10-CM

## 2019-12-04 LAB — CBC
Hematocrit: 33 % — ABNORMAL LOW (ref 34.0–46.6)
Hemoglobin: 10.1 g/dL — ABNORMAL LOW (ref 11.1–15.9)
MCH: 23.4 pg — ABNORMAL LOW (ref 26.6–33.0)
MCHC: 30.6 g/dL — ABNORMAL LOW (ref 31.5–35.7)
MCV: 77 fL — ABNORMAL LOW (ref 79–97)
Platelets: 880 10*3/uL (ref 150–450)
RBC: 4.31 x10E6/uL (ref 3.77–5.28)
RDW: 33 % — ABNORMAL HIGH (ref 11.7–15.4)
WBC: 9.4 10*3/uL (ref 3.4–10.8)

## 2019-12-07 ENCOUNTER — Other Ambulatory Visit: Payer: Self-pay | Admitting: Internal Medicine

## 2019-12-07 DIAGNOSIS — D75839 Thrombocytosis, unspecified: Secondary | ICD-10-CM

## 2019-12-30 ENCOUNTER — Ambulatory Visit: Payer: Self-pay | Admitting: Obstetrics and Gynecology

## 2020-01-22 ENCOUNTER — Other Ambulatory Visit: Payer: Self-pay | Admitting: Obstetrics & Gynecology

## 2020-02-02 ENCOUNTER — Other Ambulatory Visit: Payer: Self-pay | Admitting: Obstetrics & Gynecology

## 2020-03-22 ENCOUNTER — Other Ambulatory Visit: Payer: Self-pay

## 2020-03-22 ENCOUNTER — Encounter (HOSPITAL_COMMUNITY)
Admission: RE | Admit: 2020-03-22 | Discharge: 2020-03-22 | Disposition: A | Discharge status: Home / Self Care | Payer: BC Managed Care – PPO | Source: Ambulatory Visit | Attending: Obstetrics & Gynecology | Admitting: Obstetrics & Gynecology

## 2020-03-22 ENCOUNTER — Other Ambulatory Visit (HOSPITAL_COMMUNITY)
Admission: RE | Admit: 2020-03-22 | Discharge: 2020-03-22 | Disposition: A | Discharge status: Home / Self Care | Payer: BC Managed Care – PPO | Source: Ambulatory Visit

## 2020-03-22 ENCOUNTER — Encounter (HOSPITAL_COMMUNITY): Payer: Self-pay

## 2020-03-22 DIAGNOSIS — Z20822 Contact with and (suspected) exposure to covid-19: Secondary | ICD-10-CM | POA: Insufficient documentation

## 2020-03-22 DIAGNOSIS — Z01812 Encounter for preprocedural laboratory examination: Secondary | ICD-10-CM | POA: Insufficient documentation

## 2020-03-22 HISTORY — DX: Benign neoplasm of right breast: D24.1

## 2020-03-22 LAB — BASIC METABOLIC PANEL
Anion gap: 10 (ref 5–15)
BUN: 8 mg/dL (ref 6–20)
CO2: 20 mmol/L — ABNORMAL LOW (ref 22–32)
Calcium: 9 mg/dL (ref 8.9–10.3)
Chloride: 108 mmol/L (ref 98–111)
Creatinine, Ser: 0.65 mg/dL (ref 0.44–1.00)
GFR, Estimated: >60 mL/min (ref >60)
Glucose, Bld: 90 mg/dL (ref 70–99)
Potassium: 4.1 mmol/L (ref 3.5–5.1)
Sodium: 138 mmol/L (ref 135–145)

## 2020-03-22 LAB — CBC
HCT: 18.7 % — ABNORMAL LOW (ref 36.0–46.0)
Hemoglobin: 4.9 g/dL — CL (ref 12.0–15.0)
MCH: 19.4 pg — ABNORMAL LOW (ref 26.0–34.0)
MCHC: 26.2 g/dL — ABNORMAL LOW (ref 30.0–36.0)
MCV: 74.2 fL — ABNORMAL LOW (ref 80.0–100.0)
Platelets: 392 10*3/uL (ref 150–400)
RBC: 2.52 MIL/uL — ABNORMAL LOW (ref 3.87–5.11)
RDW: 23.1 % — ABNORMAL HIGH (ref 11.5–15.5)
WBC: 8.1 10*3/uL (ref 4.0–10.5)
nRBC: 0.2 % (ref 0.0–0.2)

## 2020-03-22 LAB — SARS CORONAVIRUS 2 (TAT 6-24 HRS): SARS Coronavirus 2: NEGATIVE

## 2020-03-22 NOTE — Progress Notes (Signed)
Anesthesia Chart Review:  Case: [id number] Date/Time: 03/24/20 0815   Procedures:      HYSTERECTOMY ABDOMINAL (N/A )     OPEN BILATERAL SALPINGECTOMY (Bilateral )   Anesthesia type: Choice   Pre-op diagnosis: fibroids   Location: [facility] OR ROOM 07 / [place] OR   Surgeons: Sanjuana Kava, MD      DISCUSSION: Patient is a 49 year old female scheduled for the above procedure. Known chronic anemia dating back to at least 2016. She has menorrhagia felt secondary to uterine fibroids.  She was admitted in 11/2019 with HGB 4.1 s/p 4 units PRBC and IV iron and was started on Megace with out-patient GYN follow-up and is now scheduled for hysterectomy.  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   [facility] COVID-19 vaccine 08/17/19. 03/22/20 COVID-19 test in process.   03/22/20 1414 lab draw showed H/H of 4.9/18.7, down from 10.1/33.0 on 12/03/19. PAT RN has notified Dr. Alwyn Pea of critical HGB of 4.9. Dr. Alwyn Pea to contact patient and advise her to go to the ED for further evaluation/treatment. PAT VS were stable with BP 119/64 and HR 97.    VS: BP 119/64   Pulse 97   Temp 36.9 C (Oral)   Resp 17   Ht 5\' 7"  (1.702 m)   Wt 88.2 kg   SpO2 100%   BMI 30.46 kg/m    PROVIDERS: Nicolette Bang, DO is PCP   LABS: 03/22/20 preoperative labs noted. See DISCUSSION.  (all labs ordered are listed, but only abnormal results are displayed)  Labs Reviewed  BASIC METABOLIC PANEL - Abnormal; Notable for the following components:      Result Value   CO2 20 (*)    All other components within normal limits  CBC - Abnormal; Notable for the following components:   RBC 2.52 (*)    Hemoglobin 4.9 (*)    HCT 18.7 (*)    MCV 74.2 (*)    MCH 19.4 (*)    MCHC 26.2 (*)    RDW 23.1 (*)    All other components within normal  limits  TYPE AND SCREEN    EKG: 11/17/19: Sinus rhythm Nonspecific intraventricular conduction delay When compared with ECG of 07/10/2018, No significant change was found Confirmed by Delora Fuel ([id number]) on 11/17/2019 5:49:27 PM   CV: N/A   Past Medical History:  Diagnosis Date  . IDA (iron deficiency anemia)   . Intraductal papilloma of right breast    s/p right breast lumpectomy 08/09/14, 05/21/18  . PONV (postoperative nausea and vomiting)     Past Surgical History:  Procedure Laterality Date  . BREAST EXCISIONAL BIOPSY Right 08/09/2014  . BREAST LUMPECTOMY Right 05/21/2018   Procedure: RIGHT BREAST LUMPECTOMY;  Surgeon: Excell Seltzer, MD;  Location: [place];  Service: General;  Laterality: Right;  . BREAST LUMPECTOMY WITH RADIOACTIVE SEED LOCALIZATION Right 08/09/2014   Procedure: RIGHT BREAST LUMPECTOMY WITH RADIOACTIVE SEED LOCALIZATION;  Surgeon: Excell Seltzer, MD;  Location: [place];  Service: General;  Laterality: Right;  . BREAST SURGERY N/A    [employer] 11/10/2019  . btl    . HERNIA REPAIR     umbilical hernia    MEDICATIONS: . ferrous sulfate 324 (65 Fe) MG TBEC  . medroxyPROGESTERone (PROVERA) 10 MG tablet   No current facility-administered medications for this encounter.  Myra Gianotti, PA-C Surgical Short Stay/Anesthesiology [facility] Phone [phone] [facility] Phone [phone] 03/22/2020 4:29 PM

## 2020-03-22 NOTE — Progress Notes (Signed)
[id number], [address] [place] [address] Phone: [phone] Fax: [phone]      Your procedure is scheduled on Thursday December 9th.  Report to [facility] Main Entrance "A" at 6:30 A.M., and check in at the Admitting office.  Call this number if you have problems the morning of surgery:  [phone]  Call [phone] if you have any questions prior to your surgery date Monday-Friday 8am-4pm    Remember:  Do not eat after midnight the night before your surgery  You may drink clear liquids until 5:30am the morning of your surgery.   Clear liquids allowed are: Water, Non-Citrus Juices (without pulp), Carbonated Beverages, Clear Tea, Black Coffee Only, and Gatorade    Take these medicines the morning of surgery with A SIP OF WATER   medroxyPROGESTERone (PROVERA) 10 MG tablet  As of today, STOP taking any Aspirin (unless otherwise instructed by your surgeon) Aleve, Naproxen, Ibuprofen, Motrin, Advil, Goody's, BC's, all herbal medications, fish oil, and all vitamins.                      Do not wear jewelry, make up, or nail polish            Do not wear lotions, powders, perfumes, or deodorant.            Do not shave 48 hours prior to surgery.              Do not bring valuables to the hospital.            [facility] is not responsible for any belongings or valuables.  Do NOT Smoke (Tobacco/Vaping) or drink Alcohol 24 hours prior to your procedure If you use a CPAP at night, you may bring all equipment for your overnight stay.   Contacts, glasses, dentures or bridgework may not be worn into surgery.      For patients admitted to the hospital, discharge time will be determined by your treatment team.   Patients discharged the day of surgery will not be allowed to drive home, and someone needs to stay with them for 24 hours.    Special instructions:   [facility]- Preparing For Surgery  Before surgery, you can play an important role. Because skin is not sterile, your skin needs to be as free of germs as possible. You can reduce the number of germs on your skin by washing with CHG (chlorahexidine gluconate) Soap before surgery.  CHG is an antiseptic cleaner which kills germs and bonds with the skin to continue killing germs even after washing.    Oral Hygiene is also important to reduce your risk of infection.  Remember - BRUSH YOUR TEETH THE MORNING OF SURGERY WITH YOUR REGULAR TOOTHPASTE  Please do not use if you have an allergy to CHG or antibacterial soaps. If your skin becomes reddened/irritated stop using the CHG.  Do not shave (including legs and underarms) for at least 48 hours prior to first CHG shower. It is OK to shave your face.  Please follow these instructions carefully.   1. Shower the NIGHT BEFORE SURGERY and the MORNING OF SURGERY with CHG Soap.   2. If you chose to wash your hair, wash your hair first as usual with your normal shampoo.  3. After you shampoo, rinse your hair and body thoroughly to remove the  shampoo.  4. Use CHG as you would any other liquid soap. You can apply CHG directly to the skin and wash gently with a scrungie or a clean washcloth.   5. Apply the CHG Soap to your body ONLY FROM THE NECK DOWN.  Do not use on open wounds or open sores. Avoid contact with your eyes, ears, mouth and genitals (private parts). Wash Face and genitals (private parts)  with your normal soap.   6. Wash thoroughly, paying special attention to the area where your surgery will be performed.  7. Thoroughly rinse your body with warm water from the neck down.  8. DO NOT shower/wash with your normal soap after using and rinsing off the CHG Soap.  9. Pat yourself dry with a CLEAN TOWEL.  10. Wear CLEAN PAJAMAS to bed the night before surgery  11. Place CLEAN SHEETS on your bed the night of your first shower and DO NOT SLEEP WITH  PETS.   Day of Surgery: Wear Clean/Comfortable clothing the morning of surgery Do not apply any deodorants/lotions.   Remember to brush your teeth WITH YOUR REGULAR TOOTHPASTE.   Please read over the following fact sheets that you were given.

## 2020-03-22 NOTE — Progress Notes (Signed)
CRITICAL VALUE STICKER  CRITICAL VALUE: Hgb 4.9   RECEIVER (on-site recipient of call): Marlowe Kays, [place] NOTIFIED: 03/22/20 @ 1609  MESSENGER (representative from lab): lab  Shelby Dubin, PA-C called [phone]  MD NOTIFIED: yes, Dr. Alwyn Pea made aware   TIME OF NOTIFICATION: 1417  RESPONSE: Dr. Alwyn Pea will call pt immediately to tell pt to come to hospital now

## 2020-03-22 NOTE — Progress Notes (Signed)
PCP - Dr. Phill Myron Cardiologist - pt denies  Chest x-ray - n/a EKG - 11/18/19 Stress Test - denies ECHO - denies Cardiac Cath - denies  Blood Thinner Instructions: n/a Aspirin Instructions: n/a  ERAS Protcol - yes PRE-SURGERY Ensure or G2- no drink   COVID TEST- 03/22/20   Anesthesia review: yes, recent blood transfusion for low hgb 6.3  Patient denies shortness of breath, fever, cough and chest pain at PAT appointment   All instructions explained to the patient, with a verbal understanding of the material. Patient agrees to go over the instructions while at home for a better understanding. Patient also instructed to self quarantine after being tested for COVID-19. The opportunity to ask questions was provided.

## 2020-03-23 ENCOUNTER — Inpatient Hospital Stay (HOSPITAL_COMMUNITY)
Admission: AD | Admit: 2020-03-23 | Discharge: 2020-03-25 | DRG: 743 | Disposition: A | Discharge status: Home / Self Care | Payer: BC Managed Care – PPO | Attending: Obstetrics & Gynecology | Admitting: Obstetrics & Gynecology

## 2020-03-23 ENCOUNTER — Other Ambulatory Visit: Payer: Self-pay | Admitting: Obstetrics & Gynecology

## 2020-03-23 ENCOUNTER — Encounter (HOSPITAL_COMMUNITY): Payer: Self-pay | Admitting: Obstetrics & Gynecology

## 2020-03-23 ENCOUNTER — Other Ambulatory Visit: Payer: Self-pay | Admitting: Obstetrics and Gynecology

## 2020-03-23 DIAGNOSIS — Z20822 Contact with and (suspected) exposure to covid-19: Secondary | ICD-10-CM | POA: Diagnosis present

## 2020-03-23 DIAGNOSIS — N800 Endometriosis of uterus: Secondary | ICD-10-CM | POA: Diagnosis present

## 2020-03-23 DIAGNOSIS — N920 Excessive and frequent menstruation with regular cycle: Secondary | ICD-10-CM | POA: Diagnosis present

## 2020-03-23 DIAGNOSIS — D649 Anemia, unspecified: Secondary | ICD-10-CM | POA: Diagnosis present

## 2020-03-23 DIAGNOSIS — D259 Leiomyoma of uterus, unspecified: Secondary | ICD-10-CM | POA: Diagnosis present

## 2020-03-23 DIAGNOSIS — D500 Iron deficiency anemia secondary to blood loss (chronic): Secondary | ICD-10-CM | POA: Diagnosis present

## 2020-03-23 LAB — SURGICAL PCR SCREEN
MRSA, PCR: NEGATIVE
Staphylococcus aureus: POSITIVE — AB

## 2020-03-23 LAB — PREPARE RBC (CROSSMATCH)

## 2020-03-23 MED ORDER — DIPHENHYDRAMINE HCL 25 MG PO CAPS
25.0000 mg | ORAL_CAPSULE | Freq: Once | ORAL | Status: AC
  Administered 2020-03-23: 25 mg via ORAL
  Filled 2020-03-23: qty 1

## 2020-03-23 MED ORDER — PRENATAL MULTIVITAMIN CH
1.0000 | ORAL_TABLET | Freq: Every day | ORAL | Status: DC
  Administered 2020-03-24 – 2020-03-25 (×2): 1 via ORAL
  Filled 2020-03-23 (×2): qty 1

## 2020-03-23 MED ORDER — ACETAMINOPHEN 325 MG PO TABS: 650.0000 mg | ORAL_TABLET | Freq: Four times a day (QID) | ORAL | Status: DC | PRN

## 2020-03-23 MED ORDER — SODIUM CHLORIDE 0.9% FLUSH: 3.0000 mL | INTRAVENOUS | Status: DC | PRN

## 2020-03-23 MED ORDER — SODIUM CHLORIDE 0.9 % IV SOLN: 250.0000 mL | INTRAVENOUS | Status: DC | PRN

## 2020-03-23 MED ORDER — SODIUM CHLORIDE 0.9% IV SOLUTION: Freq: Once | INTRAVENOUS | Status: AC

## 2020-03-23 MED ORDER — ACETAMINOPHEN 325 MG PO TABS
650.0000 mg | ORAL_TABLET | Freq: Once | ORAL | Status: AC
  Administered 2020-03-23: 650 mg via ORAL
  Filled 2020-03-23: qty 2

## 2020-03-23 MED ORDER — ZOLPIDEM TARTRATE 5 MG PO TABS: 5.0000 mg | ORAL_TABLET | Freq: Every evening | ORAL | Status: DC | PRN

## 2020-03-23 MED ORDER — MUPIROCIN 2 % EX OINT
1.0000 "application " | TOPICAL_OINTMENT | Freq: Two times a day (BID) | CUTANEOUS | Status: DC
  Administered 2020-03-23 – 2020-03-25 (×3): 1 via NASAL
  Filled 2020-03-23: qty 22

## 2020-03-23 MED ORDER — SODIUM CHLORIDE 0.9% FLUSH
3.0000 mL | Freq: Two times a day (BID) | INTRAVENOUS | Status: DC
  Administered 2020-03-23 – 2020-03-25 (×4): 3 mL via INTRAVENOUS

## 2020-03-23 NOTE — Plan of Care (Signed)

## 2020-03-23 NOTE — H&P (Addendum)
Sharon Meyer is a 49 y.o. female,  Per Dr Alwyn Pea with [facility]: Pt being admitted x1 day prior to total hysterectomy with bilateral salpingectomy for symptomatic fibroids with started HGB of 4.7, pt denies s/sx. For relevant family history, patient reports family history of breast cancer but reports no family history of ovarian cancer, no family history of uterine cancer, no family history of colon cancer, and no family history of blood clots/dvt. For patient relationship to practice, she reports new patient. For current medical history, she reports active medical problems __. For menstrual history, she reports frequency of menses: monthly, duration of flow: 7 days, number of days of heavy flow: __, clots: no, and cramps: no. For contraceptive method, she reports satisfied and current method used: tubal sterilization. For sexually active, she reports yes: __. For sti screen, she reports declines. For health/prevention, she reports exercise: yes, breast self exam: yes, seat belt use: yes, safe sex no, and tobacco use: no. For mammogram, she reports due. For pap smear +/- hpv cotesting, she reports due. For colonoscopy, she reports not applicable. For patient has, she reports primary care physician: yes. More recently pt having monthly irregular menses which megestrol 40 mg PO BID has shown little improvement, which have become more heavy with clots.   Sharon Meyer is a 49 yo with symptomatic fibroid uterus manifested by abnormal uterine bleeding. She possibly has adenomyosis as seen on pelvic US. Normal endometrial biopsy confirmed. Pt desires definitive surgical intervention. We discussed the route of hysterectomy: The plan is to proceed with an abdominal hysterectomy with bilateral salpingectomy.  We reviewed the risks of a hysterectomy were discussed with the patient to include, but not limited to bleeding possibly requiring a transfusion, infection, wound breakdown or poor healing, damage to  organs in the abdomen /pelvis w/ possible need for further surgical repair (like bowel or bladder injury), blood clots, PE/MI/stroke. We also discussed long term risk of post op prolapse, worsening of UI sx, possible need for [facility] or further surgery for repair.  All questions placed by patient answered. Patient voiced understanding and consents to surgery. We also discussed ovarian preservation. She was counseled about the 1/70 lifetime risk of ovarian cancer and 5-10% risk for future ovarian surgery after the hysterectomy. PT desires preservation of her ovaries.   01/25/2020 [place]:    Date of LMP: 02-29-2020 Date of last mammogram: 04-16-2018 Lump right breast Date of last Pap smear: 12-28-2019 Normal HPV Test: Never HPV Vaccination: Not Applicable History of Abnormal PAP: No Sexually Active: Yes Sexual Orientation: Heterosexual History of Sexually Transmitted Infection: No Current Birth Control Method:: Tubal Ligation Age at Menarche:: 48 Age at Menopause: N/A History of Endometriosis: No History of Fibroids: Yes History of Infertility: No History of Recurrent Ovarian Cysts: No History of PCOS: No History of Dysmenorrhea: Yes Menstrual Cycle Length (days): 7  OB History G4P4 Past Pregnancies 10-28-1987 1. F, 6lbs 7oz, Vaginal Delivery 12-05-1993 1. F, 7lbs 5oz, Vaginal Delivery 05-27-1996 1. F, 8lbs 11oz, Vaginal Delivery 02-15-1998 1. F, 8lbs 13oz, Vaginal Delivery   Patient Active Problem List   Diagnosis Date Noted  . Menorrhagia 03/23/2020  . Menorrhagia with regular cycle 11/19/2019  . Anemia 11/17/2019  . Iron deficiency anemia 07/11/2018  . Symptomatic anemia 07/10/2018  . Abnormality of breast on screening mammography 01/30/2018  . Papilloma of breast right  01/30/2018     Medications Prior to Admission  Medication Sig Dispense Refill Last Dose  . ferrous sulfate 324 (65 Fe) MG  TBEC Take 1 tablet (325 mg total) by mouth daily. (Patient taking differently:  Take 1 tablet by mouth at bedtime. ) 90 tablet 1   . medroxyPROGESTERone (PROVERA) 10 MG tablet Take 10 mg by mouth daily.       Past Medical History:  Diagnosis Date  . IDA (iron deficiency anemia)   . Intraductal papilloma of right breast    s/p right breast lumpectomy 08/09/14, 05/21/18  . PONV (postoperative nausea and vomiting)      No current facility-administered medications on file prior to encounter.   Current Outpatient Medications on File Prior to Encounter  Medication Sig Dispense Refill  . ferrous sulfate 324 (65 Fe) MG TBEC Take 1 tablet (325 mg total) by mouth daily. (Patient taking differently: Take 1 tablet by mouth at bedtime. ) 90 tablet 1  . medroxyPROGESTERone (PROVERA) 10 MG tablet Take 10 mg by mouth daily.       No Known Allergies  OB History   No obstetric history on file.    Past Medical History:  Diagnosis Date  . IDA (iron deficiency anemia)   . Intraductal papilloma of right breast    s/p right breast lumpectomy 08/09/14, 05/21/18  . PONV (postoperative nausea and vomiting)    Past Surgical History:  Procedure Laterality Date  . BREAST EXCISIONAL BIOPSY Right 08/09/2014  . BREAST LUMPECTOMY Right 05/21/2018   Procedure: RIGHT BREAST LUMPECTOMY;  Surgeon: Excell Seltzer, MD;  Location: [facility];  Service: General;  Laterality: Right;  . BREAST LUMPECTOMY WITH RADIOACTIVE SEED LOCALIZATION Right 08/09/2014   Procedure: RIGHT BREAST LUMPECTOMY WITH RADIOACTIVE SEED LOCALIZATION;  Surgeon: Excell Seltzer, MD;  Location: [facility];  Service: General;  Laterality: Right;  . BREAST SURGERY N/A    Phreesia 11/10/2019  . btl    . HERNIA REPAIR     umbilical hernia   Family History: family history includes Healthy in her father and mother. Social History:  reports that she has never smoked. She has never used smokeless tobacco. She reports that she does not drink alcohol and does not use drugs.  ROS:  Review of  Systems  Constitutional: Negative for diaphoresis.  HENT: Negative.   Eyes: Negative.   Respiratory: Negative.   Cardiovascular: Negative.   Gastrointestinal: Negative.   Genitourinary: Negative.        Vaginal bleeding   Musculoskeletal: Negative.   Skin: Negative.   Neurological: Negative.   Endo/Heme/Allergies: Negative.   Psychiatric/Behavioral: Negative.      Physical Exam: BP 103/69   Pulse 89   Temp 98.5 F (36.9 C) (Oral)   Resp 18   Ht 5\' 7"  (1.702 m)   Wt 88.2 kg   SpO2 100%   BMI 30.45 kg/m   Physical Exam Vitals and nursing note reviewed. Exam conducted with a chaperone present.  Constitutional:      Appearance: Normal appearance.  HENT:     Head: Normocephalic.     Nose: Nose normal.     Mouth/Throat:     Mouth: Mucous membranes are moist.  Eyes:     Conjunctiva/sclera: Conjunctivae normal.     Pupils: Pupils are equal, round, and reactive to light.  Cardiovascular:     Rate and Rhythm: Normal rate and regular rhythm.     Pulses: Normal pulses.     Heart sounds: Normal heart sounds.  Pulmonary:     Effort: Pulmonary effort is normal.     Breath sounds: Normal breath sounds.  Abdominal:  General: Bowel sounds are normal.     Palpations: Abdomen is soft.  Genitourinary:    Comments: Uterus 16 weeks in size Musculoskeletal:        General: Normal range of motion.     Cervical back: Normal range of motion and neck supple.  Skin:    General: Skin is warm.     Capillary Refill: Capillary refill takes less than 2 seconds.  Neurological:     General: No focal deficit present.     Mental Status: She is alert and oriented to person, place, and time.  Psychiatric:        Mood and Affect: Mood normal.      Labs: Results for orders placed or performed during the hospital encounter of 03/23/20 (from the past 24 hour(s))  Prepare RBC (crossmatch)     Status: None   Collection Time: 03/23/20 11:53 AM  Result Value Ref Range   Order Confirmation       ORDER PROCESSED BY BLOOD BANK Performed at [facility], [address] [place], [place] [address]   Surgical PCR screen     Status: Abnormal   Collection Time: 03/23/20  1:55 PM   Specimen: Nasal Mucosa; Nasal Swab  Result Value Ref Range   MRSA, PCR NEGATIVE NEGATIVE   Staphylococcus aureus POSITIVE (A) NEGATIVE    Imaging:  No results found.  MAU Course: Orders Placed This Encounter  Procedures  . Surgical PCR screen  . Diet regular Room service appropriate? Yes; Fluid consistency: Thin  . Diet NPO time specified  . Informed Consent Details: Physician/Practitioner Attestation; Transcribe to consent form and obtain patient signature  . H & H post transfustion-  RN to place lab order with appropriate draw time  . If PCR screen is positive for Staphylococcus Aureus: Initiate Staphylococcus Aureus Positive Standing Orders and notify Surgeon of positive result  . If PCR Screen is Positive for MRSA: Initiate Methicillin Resistant Staphylococcus aureus (MRSA) PCR Positive Standing Orders and notify surgeon of positive result.  . Swab Process: 1.  Use the double-swab Venturi Transystem Transport Swab (green writing) to collect the specimen. 2.  Insert the dry culture swabs 1-2 cm into the patient's nostril and rotate the swabs against the inside of the nostril for 3 second...  . Vital signs On admission to unit, Q 30 mins x 1, Q 60 mins x 1, then Q 4 hours x 4, then Q shift until discharge  . Notify physician (specify)  . Initiate Oral Care Protocol  . Initiate Carrier Fluid Protocol  . SCDs  . Up as tolerated  . Full code  . Prepare RBC (crossmatch)  . Admit to Inpatient (patient's expected length of stay will be greater than 2 midnights or inpatient only procedure)   Meds ordered this encounter  Medications  . 0.9 %  sodium chloride infusion (Manually program via Guardrails IV Fluids)  . acetaminophen (TYLENOL) tablet 650 mg  . diphenhydrAMINE (BENADRYL) capsule 25 mg   . mupirocin ointment (BACTROBAN) 2 % 1 application  . prenatal multivitamin tablet 1 tablet  . zolpidem (AMBIEN) tablet 5 mg  . acetaminophen (TYLENOL) tablet 650 mg  . sodium chloride flush (NS) 0.9 % injection 3 mL  . 0.9 %  sodium chloride infusion  . sodium chloride flush (NS) 0.9 % injection 3 mL    Assessment/Plan: Sharon Meyer is a 49 y.o. female,  Per Dr Alwyn Pea with [facility]: Pt being admitted x1 day prior to total hysterectomy  with bilateral salpingectomy for symptomatic fibroids with started HGB of 4.7, pt denies s/sx. For relevant family history, patient reports family history of breast cancer but reports no family history of ovarian cancer, no family history of uterine cancer, no family history of colon cancer, and no family history of blood clots/dvt. For patient relationship to practice, she reports new patient. For current medical history, she reports active medical problems __. For menstrual history, she reports frequency of menses: monthly, duration of flow: 7 days, number of days of heavy flow: __, clots: no, and cramps: no. For contraceptive method, she reports satisfied and current method used: tubal sterilization. For sexually active, she reports yes: __. For sti screen, she reports declines. For health/prevention, she reports exercise: yes, breast self exam: yes, seat belt use: yes, safe sex no, and tobacco use: no. For mammogram, she reports due. For pap smear +/- hpv cotesting, she reports due. For colonoscopy, she reports not applicable. For patient has, she reports primary care physician: yes. More recently pt having monthly irregular menses which megestrol 40 mg PO BID has shown little improvement, which have become more heavy with clots.   Sharon Meyer is a 49 yo with symptomatic fibroid uterus manifested by abnormal uterine bleeding. She possibly has adenomyosis as seen on pelvic US. Normal endometrial biopsy confirmed. Pt desires definitive surgical  intervention. We discussed the route of hysterectomy: The plan is to proceed with an abdominal hysterectomy with bilateral salpingectomy.  We reviewed the risks of a hysterectomy were discussed with the patient to include, but not limited to bleeding possibly requiring a transfusion, infection, wound breakdown or poor healing, damage to organs in the abdomen /pelvis w/ possible need for further surgical repair (like bowel or bladder injury), blood clots, PE/MI/stroke. We also discussed long term risk of post op prolapse, worsening of UI sx, possible need for [facility] or further surgery for repair.  All questions placed by patient answered. Patient voiced understanding and consents to surgery. We also discussed ovarian preservation. She was counseled about the 1/70 lifetime risk of ovarian cancer and 5-10% risk for future ovarian surgery after the hysterectomy. PT desires preservation of her ovaries.   Plan: Admit to Med-Surg per consult with DR Alwyn Pea Routine [facility] orders Transfuse x4 unit of RBC Tylenol and benadryl to be given prior to transfusion CBC to be check four hours post transfusion.  Regular diet until NPO at [place] Pre-op ordered for tomorrow will be placed by DR Lazer Wollard Pain med/epidural prn   [facility] NP-C, CNM, MSN 03/23/2020, 5:59 PM  MD addendum 03/23/20 I have reviewed the above documentation.  I am familiar with the patient, and I agree with the Assessment and plan.  I am the supervising physician and agree to al of the above.  Patient was counseled at to the risks of at Total Hysterectomy bilateral salpingectomy to include hemorrhage requiring further transfusions.  Preoperative Hb 9.8. We discussed infection, injury to adjacent blood vessels and organs like the bladder, bowel, ureters requiring further surgery or having an intraoperative General surgery consultation.  We discussed risk of ovarian preservation including 1/70 lifetime risk of ovarian cancer.  Patient voiced  understanding and agrees to proceed. Consent signed, witnessed and placed into chart.  Rogelio Seen Montrez [place]

## 2020-03-23 NOTE — Anesthesia Preprocedure Evaluation (Signed)
Anesthesia Evaluation  Patient identified by MRN, date of birth, ID band Patient awake    Reviewed: Allergy & Precautions, NPO status , Patient's Chart, lab work & pertinent test results  History of Anesthesia Complications (+) PONV  Airway Mallampati: II  TM Distance: >3 FB     Dental   Pulmonary    breath sounds clear to auscultation       Cardiovascular negative cardio ROS   Rhythm:Regular Rate:Normal     Neuro/Psych    GI/Hepatic Neg liver ROS,   Endo/Other  negative endocrine ROS  Renal/GU negative Renal ROS     Musculoskeletal   Abdominal   Peds  Hematology  (+) anemia ,   Anesthesia Other Findings   Reproductive/Obstetrics                            Anesthesia Physical Anesthesia Plan  ASA: II  Anesthesia Plan: General   Post-op Pain Management:    Induction: Intravenous  PONV Risk Score and Plan: 3 and Ondansetron, Dexamethasone and Midazolam  Airway Management Planned: Oral ETT  Additional Equipment:   Intra-op Plan:   Post-operative Plan: Extubation in OR  Informed Consent: I have reviewed the patients History and Physical, chart, labs and discussed the procedure including the risks, benefits and alternatives for the proposed anesthesia with the patient or authorized representative who has indicated his/her understanding and acceptance.     Dental advisory given  Plan Discussed with: CRNA and Anesthesiologist  Anesthesia Plan Comments:         Anesthesia Quick Evaluation

## 2020-03-23 NOTE — Progress Notes (Signed)
Pt came in as a direct admission for anemia. Several calls to MD's office unanswered . Unable to proceed as no admission orders in place. Will try calling MD office again

## 2020-03-24 ENCOUNTER — Encounter (HOSPITAL_COMMUNITY): Payer: Self-pay | Admitting: Obstetrics & Gynecology

## 2020-03-24 ENCOUNTER — Inpatient Hospital Stay (HOSPITAL_COMMUNITY)
Admission: RE | Admit: 2020-03-24 | Payer: BC Managed Care – PPO | Source: Home / Self Care | Admitting: Obstetrics & Gynecology

## 2020-03-24 ENCOUNTER — Inpatient Hospital Stay (HOSPITAL_COMMUNITY): Payer: BC Managed Care – PPO | Admitting: Vascular Surgery

## 2020-03-24 ENCOUNTER — Inpatient Hospital Stay (HOSPITAL_COMMUNITY): Payer: BC Managed Care – PPO | Admitting: Anesthesiology

## 2020-03-24 ENCOUNTER — Encounter (HOSPITAL_COMMUNITY)
Admission: AD | Disposition: A | Discharge status: Home / Self Care | Payer: Self-pay | Source: Home / Self Care | Attending: Obstetrics & Gynecology

## 2020-03-24 DIAGNOSIS — Z90710 Acquired absence of both cervix and uterus: Secondary | ICD-10-CM | POA: Insufficient documentation

## 2020-03-24 HISTORY — PX: ABDOMINAL HYSTERECTOMY: SHX81

## 2020-03-24 HISTORY — PX: CYSTOSCOPY: SHX5120

## 2020-03-24 LAB — HEMOGLOBIN AND HEMATOCRIT, BLOOD
HCT: 30.2 % — ABNORMAL LOW (ref 36.0–46.0)
Hemoglobin: 9.8 g/dL — ABNORMAL LOW (ref 12.0–15.0)

## 2020-03-24 LAB — PREPARE RBC (CROSSMATCH)

## 2020-03-24 SURGERY — HYSTERECTOMY, ABDOMINAL
Anesthesia: General

## 2020-03-24 MED ORDER — ONDANSETRON HCL 4 MG/2ML IJ SOLN
4.0000 mg | Freq: Four times a day (QID) | INTRAMUSCULAR | Status: DC | PRN
  Administered 2020-03-24: 4 mg via INTRAVENOUS
  Filled 2020-03-24: qty 2

## 2020-03-24 MED ORDER — PROPOFOL 500 MG/50ML IV EMUL
INTRAVENOUS | Status: DC | PRN
  Administered 2020-03-24: 25 ug/kg/min via INTRAVENOUS

## 2020-03-24 MED ORDER — KETOROLAC TROMETHAMINE 30 MG/ML IJ SOLN
INTRAMUSCULAR | Status: AC
  Filled 2020-03-24: qty 1

## 2020-03-24 MED ORDER — FENTANYL CITRATE (PF) 100 MCG/2ML IJ SOLN: 25.0000 ug | INTRAMUSCULAR | Status: DC | PRN

## 2020-03-24 MED ORDER — IBUPROFEN 800 MG PO TABS
800.0000 mg | ORAL_TABLET | Freq: Four times a day (QID) | ORAL | Status: DC
  Administered 2020-03-25: 800 mg via ORAL
  Filled 2020-03-24 (×2): qty 1

## 2020-03-24 MED ORDER — LACTATED RINGERS IV SOLN: INTRAVENOUS | Status: DC | PRN

## 2020-03-24 MED ORDER — SUCCINYLCHOLINE CHLORIDE 200 MG/10ML IV SOSY
PREFILLED_SYRINGE | INTRAVENOUS | Status: DC | PRN
  Administered 2020-03-24: 100 mg via INTRAVENOUS

## 2020-03-24 MED ORDER — CEFAZOLIN SODIUM-DEXTROSE 2-4 GM/100ML-% IV SOLN
2.0000 g | Freq: Once | INTRAVENOUS | Status: DC
Start: 2020-03-24 — End: 2020-03-24

## 2020-03-24 MED ORDER — BUPIVACAINE LIPOSOME 1.3 % IJ SUSP
INTRAMUSCULAR | Status: DC | PRN
  Administered 2020-03-24: 20 mL

## 2020-03-24 MED ORDER — SUGAMMADEX SODIUM 200 MG/2ML IV SOLN
INTRAVENOUS | Status: DC | PRN
  Administered 2020-03-24: 180 mg via INTRAVENOUS

## 2020-03-24 MED ORDER — HYDROMORPHONE HCL 1 MG/ML IJ SOLN: 1.0000 mg | INTRAMUSCULAR | Status: DC | PRN

## 2020-03-24 MED ORDER — OXYCODONE HCL 5 MG PO TABS: 5.0000 mg | ORAL_TABLET | ORAL | Status: DC | PRN

## 2020-03-24 MED ORDER — STERILE WATER FOR IRRIGATION IR SOLN
Status: DC | PRN
  Administered 2020-03-24: 1000 mL via INTRAVESICAL

## 2020-03-24 MED ORDER — MIDAZOLAM HCL 5 MG/5ML IJ SOLN
INTRAMUSCULAR | Status: DC | PRN
  Administered 2020-03-24: 2 mg via INTRAVENOUS

## 2020-03-24 MED ORDER — LACTATED RINGERS IV SOLN: INTRAVENOUS | Status: DC

## 2020-03-24 MED ORDER — SIMETHICONE 80 MG PO CHEW
80.0000 mg | CHEWABLE_TABLET | Freq: Four times a day (QID) | ORAL | Status: DC
  Administered 2020-03-25 (×2): 80 mg via ORAL
  Filled 2020-03-24 (×3): qty 1

## 2020-03-24 MED ORDER — CHLORHEXIDINE GLUCONATE 0.12 % MT SOLN
OROMUCOSAL | Status: AC
  Administered 2020-03-24: 15 mL via OROMUCOSAL
  Filled 2020-03-24: qty 15

## 2020-03-24 MED ORDER — CHLORHEXIDINE GLUCONATE CLOTH 2 % EX PADS: 6.0000 | MEDICATED_PAD | Freq: Every day | CUTANEOUS | Status: DC

## 2020-03-24 MED ORDER — ONDANSETRON HCL 4 MG/2ML IJ SOLN
INTRAMUSCULAR | Status: DC | PRN
  Administered 2020-03-24: 4 mg via INTRAVENOUS

## 2020-03-24 MED ORDER — DEXAMETHASONE SODIUM PHOSPHATE 10 MG/ML IJ SOLN
INTRAMUSCULAR | Status: DC | PRN
  Administered 2020-03-24: 10 mg via INTRAVENOUS

## 2020-03-24 MED ORDER — ACETAMINOPHEN 10 MG/ML IV SOLN
INTRAVENOUS | Status: DC | PRN
  Administered 2020-03-24: 1000 mg via INTRAVENOUS

## 2020-03-24 MED ORDER — KETOROLAC TROMETHAMINE 30 MG/ML IJ SOLN
30.0000 mg | Freq: Four times a day (QID) | INTRAMUSCULAR | Status: AC
  Administered 2020-03-24 – 2020-03-25 (×4): 30 mg via INTRAVENOUS
  Filled 2020-03-24 (×4): qty 1

## 2020-03-24 MED ORDER — KETOROLAC TROMETHAMINE 30 MG/ML IJ SOLN
30.0000 mg | Freq: Once | INTRAMUSCULAR | Status: AC
  Administered 2020-03-24: 30 mg via INTRAVENOUS

## 2020-03-24 MED ORDER — CEFAZOLIN SODIUM-DEXTROSE 2-4 GM/100ML-% IV SOLN
INTRAVENOUS | Status: AC
  Filled 2020-03-24: qty 100

## 2020-03-24 MED ORDER — BUPIVACAINE LIPOSOME 1.3 % IJ SUSP
20.0000 mL | Freq: Once | INTRAMUSCULAR | Status: DC
  Filled 2020-03-24: qty 20

## 2020-03-24 MED ORDER — LIDOCAINE 2% (20 MG/ML) 5 ML SYRINGE
INTRAMUSCULAR | Status: DC | PRN
  Administered 2020-03-24: 80 mg via INTRAVENOUS

## 2020-03-24 MED ORDER — PANTOPRAZOLE SODIUM 40 MG IV SOLR
40.0000 mg | Freq: Every day | INTRAVENOUS | Status: DC
  Administered 2020-03-24: 40 mg via INTRAVENOUS
  Filled 2020-03-24: qty 40

## 2020-03-24 MED ORDER — SODIUM CHLORIDE 0.9 % IV SOLN: 10.0000 mL/h | Freq: Once | INTRAVENOUS | Status: DC

## 2020-03-24 MED ORDER — SCOPOLAMINE 1 MG/3DAYS TD PT72
MEDICATED_PATCH | TRANSDERMAL | Status: DC | PRN
  Administered 2020-03-24: 1 via TRANSDERMAL

## 2020-03-24 MED ORDER — PHENYLEPHRINE HCL-NACL 10-0.9 MG/250ML-% IV SOLN
INTRAVENOUS | Status: DC | PRN
  Administered 2020-03-24: 20 ug/min via INTRAVENOUS

## 2020-03-24 MED ORDER — VASOPRESSIN 20 UNIT/ML IV SOLN
INTRAVENOUS | Status: DC | PRN
  Administered 2020-03-24: 6 mL via INTRAMUSCULAR

## 2020-03-24 MED ORDER — ONDANSETRON HCL 4 MG PO TABS: 4.0000 mg | ORAL_TABLET | Freq: Four times a day (QID) | ORAL | Status: DC | PRN

## 2020-03-24 MED ORDER — GABAPENTIN 100 MG PO CAPS
100.0000 mg | ORAL_CAPSULE | Freq: Three times a day (TID) | ORAL | Status: DC
  Administered 2020-03-24 – 2020-03-25 (×4): 100 mg via ORAL
  Filled 2020-03-24 (×5): qty 1

## 2020-03-24 MED ORDER — FENTANYL CITRATE (PF) 100 MCG/2ML IJ SOLN
INTRAMUSCULAR | Status: DC | PRN
  Administered 2020-03-24: 50 ug via INTRAVENOUS
  Administered 2020-03-24: 25 ug via INTRAVENOUS
  Administered 2020-03-24: 150 ug via INTRAVENOUS
  Administered 2020-03-24: 25 ug via INTRAVENOUS

## 2020-03-24 MED ORDER — ACETAMINOPHEN 500 MG PO TABS
1000.0000 mg | ORAL_TABLET | Freq: Four times a day (QID) | ORAL | Status: DC
  Administered 2020-03-24 – 2020-03-25 (×4): 1000 mg via ORAL
  Filled 2020-03-24 (×5): qty 2

## 2020-03-24 MED ORDER — SODIUM CHLORIDE 0.9 % IV SOLN: INTRAVENOUS | Status: DC | PRN

## 2020-03-24 MED ORDER — CEFAZOLIN SODIUM-DEXTROSE 2-4 GM/100ML-% IV SOLN
2.0000 g | Freq: Once | INTRAVENOUS | Status: AC
  Administered 2020-03-24: 2 g via INTRAVENOUS
  Filled 2020-03-24: qty 100

## 2020-03-24 MED ORDER — PROPOFOL 10 MG/ML IV BOLUS
INTRAVENOUS | Status: DC | PRN
  Administered 2020-03-24: 150 mg via INTRAVENOUS

## 2020-03-24 MED ORDER — CHLORHEXIDINE GLUCONATE 0.12 % MT SOLN: 15.0000 mL | Freq: Once | OROMUCOSAL | Status: AC

## 2020-03-24 MED ORDER — DIPHENHYDRAMINE HCL 50 MG/ML IJ SOLN: 25.0000 mg | Freq: Four times a day (QID) | INTRAMUSCULAR | Status: DC | PRN

## 2020-03-24 MED ORDER — MENTHOL 3 MG MT LOZG: 1.0000 | LOZENGE | OROMUCOSAL | Status: DC | PRN

## 2020-03-24 MED ORDER — SENNOSIDES-DOCUSATE SODIUM 8.6-50 MG PO TABS: 1.0000 | ORAL_TABLET | Freq: Every evening | ORAL | Status: DC | PRN

## 2020-03-24 MED ORDER — ROCURONIUM BROMIDE 10 MG/ML (PF) SYRINGE
PREFILLED_SYRINGE | INTRAVENOUS | Status: DC | PRN
  Administered 2020-03-24 (×2): 50 mg via INTRAVENOUS

## 2020-03-24 MED ORDER — BUPIVACAINE HCL (PF) 0.5 % IJ SOLN
INTRAMUSCULAR | Status: DC | PRN
  Administered 2020-03-24: 30 mL

## 2020-03-24 MED ORDER — DIPHENHYDRAMINE HCL 25 MG PO CAPS: 25.0000 mg | ORAL_CAPSULE | Freq: Four times a day (QID) | ORAL | Status: DC | PRN

## 2020-03-24 SURGICAL SUPPLY — 35 items
COVER WAND RF STERILE (DRAPES) ×5 IMPLANT
DRAPE UNDERBUTTOCKS STRL (DISPOSABLE) ×5 IMPLANT
DRAPE WARM FLUID 44X44 (DRAPES) ×5 IMPLANT
DRSG OPSITE POSTOP 4X10 (GAUZE/BANDAGES/DRESSINGS) ×5 IMPLANT
DURAPREP 26ML APPLICATOR (WOUND CARE) ×5 IMPLANT
GLOVE BIO SURGEON STRL SZ7 (GLOVE) ×5 IMPLANT
GLOVE BIOGEL PI IND STRL 7.0 (GLOVE) ×9 IMPLANT
GLOVE BIOGEL PI INDICATOR 7.0 (GLOVE) ×6
GOWN STRL REUS W/ TWL LRG LVL3 (GOWN DISPOSABLE) ×3 IMPLANT
GOWN STRL REUS W/TWL LRG LVL3 (GOWN DISPOSABLE) ×5
GOWN STRL REUS W/TWL XL LVL3 (GOWN DISPOSABLE) ×10 IMPLANT
HEMOSTAT ARISTA ABSORB 3G PWDR (HEMOSTASIS) ×5 IMPLANT
HIBICLENS CHG 4% 4OZ BTL (MISCELLANEOUS) ×5 IMPLANT
KIT TURNOVER KIT B (KITS) ×5 IMPLANT
LIGASURE IMPACT 36 18CM CVD LR (INSTRUMENTS) ×5 IMPLANT
MANIFOLD NEPTUNE II (INSTRUMENTS) ×5 IMPLANT
NEEDLE HYPO 22GX1.5 SAFETY (NEEDLE) ×5 IMPLANT
PACK ABDOMINAL GYN (CUSTOM PROCEDURE TRAY) ×5 IMPLANT
PAD ARMBOARD 7.5X6 YLW CONV (MISCELLANEOUS) ×10 IMPLANT
PAD OB MATERNITY 4.3X12.25 (PERSONAL CARE ITEMS) ×5 IMPLANT
RTRCTR C-SECT PINK 25CM LRG (MISCELLANEOUS) ×5 IMPLANT
SET CYSTO W/LG BORE CLAMP LF (SET/KITS/TRAYS/PACK) ×5 IMPLANT
SPECIMEN JAR MEDIUM (MISCELLANEOUS) ×5 IMPLANT
SPONGE LAP 18X18 RF (DISPOSABLE) ×10 IMPLANT
SUT CHROMIC 2 0 CT 1 (SUTURE) ×10 IMPLANT
SUT PLAIN 2 0 XLH (SUTURE) ×5 IMPLANT
SUT VIC AB 0 CT1 27 (SUTURE) ×30
SUT VIC AB 0 CT1 27XBRD ANBCTR (SUTURE) ×12 IMPLANT
SUT VIC AB 0 CT1 27XCR 8 STRN (SUTURE) ×6 IMPLANT
SUT VIC AB 2-0 CT1 (SUTURE) ×5 IMPLANT
SUT VIC AB 4-0 KS 27 (SUTURE) ×5 IMPLANT
SUT VICRYL 0 TIES 12 18 (SUTURE) ×5 IMPLANT
SYR CONTROL 10ML LL (SYRINGE) ×5 IMPLANT
TOWEL GREEN STERILE FF (TOWEL DISPOSABLE) ×5 IMPLANT
TRAY FOLEY W/BAG SLVR 14FR (SET/KITS/TRAYS/PACK) ×5 IMPLANT

## 2020-03-24 NOTE — Progress Notes (Signed)
Urine preg not obtained before patient went to surgery. Per Dr. Alwyn Pea not needed for surgery.

## 2020-03-24 NOTE — Transfer of Care (Signed)
Immediate Anesthesia Transfer of Care Note  Patient: Sharon Meyer  Procedure(s) Performed: ABDOMINAL HYSTERECTOMY WITH BILATERAL SALPINGECTOMY (N/A ) CYSTOSCOPY  Patient Location: PACU  Anesthesia Type:General  Level of Consciousness: drowsy  Airway & Oxygen Therapy: Patient Spontanous Breathing and Patient connected to face mask oxygen  Post-op Assessment: Report given to RN and Post -op Vital signs reviewed and stable  Post vital signs: Reviewed and stable  Last Vitals:  Vitals Value Taken Time  BP 130/72 03/24/20 1116  Temp    Pulse 78 03/24/20 1120  Resp 16 03/24/20 1120  SpO2 100 % 03/24/20 1120  Vitals shown include unvalidated device data.  Last Pain:  Vitals:   03/24/20 0512  TempSrc: Oral  PainSc:          Complications: No complications documented.

## 2020-03-24 NOTE — Anesthesia Postprocedure Evaluation (Signed)
Anesthesia Post Note  Patient: Sharon Meyer  Procedure(s) Performed: ABDOMINAL HYSTERECTOMY WITH BILATERAL SALPINGECTOMY (N/A ) CYSTOSCOPY     Patient location during evaluation: PACU Anesthesia Type: General Level of consciousness: awake Pain management: pain level controlled Vital Signs Assessment: post-procedure vital signs reviewed and stable Respiratory status: spontaneous breathing Cardiovascular status: stable Postop Assessment: no apparent nausea or vomiting Anesthetic complications: no   No complications documented.  Last Vitals:  Vitals:   03/24/20 1301 03/24/20 1725  BP: 118/68 115/78  Pulse: 80 80  Resp: 17 18  Temp: 36.9 C 36.9 C  SpO2: 99% 100%    Last Pain:  Vitals:   03/24/20 1725  TempSrc: Oral  PainSc:                  Kaci Freel

## 2020-03-24 NOTE — Anesthesia Procedure Notes (Signed)
Procedure Name: Intubation Date/Time: 03/24/2020 8:51 AM Performed by: Cleda Daub, CRNA Pre-anesthesia Checklist: Patient identified, Emergency Drugs available, Suction available and Patient being monitored Patient Re-evaluated:Patient Re-evaluated prior to induction Oxygen Delivery Method: Circle system utilized Preoxygenation: Pre-oxygenation with 100% oxygen Induction Type: IV induction Ventilation: Mask ventilation without difficulty Laryngoscope Size: Mac and 3 Grade View: Grade I Tube type: Oral Tube size: 7.0 mm Number of attempts: 1 Airway Equipment and Method: Stylet and Oral airway Placement Confirmation: ETT inserted through vocal cords under direct vision,  positive ETCO2 and breath sounds checked- equal and bilateral Secured at: 22 cm Tube secured with: Tape Dental Injury: Teeth and Oropharynx as per pre-operative assessment

## 2020-03-24 NOTE — Discharge Instructions (Signed)
Call [facility] @ [phone] if:  You have a temperature greater than or equal to 100.4 degrees Farenheit orally You have pain that is not made better by the pain medication given and taken as directed You have excessive bleeding or problems urinating  Take Colace (Docusate Sodium/Stool Softener) 100 mg 2-3 times daily while taking narcotic pain medicine to avoid constipation or until bowel movements are regular.  Take  an over the counter iron supplement of your choice every other day for the next 6 months Take with food, Ibuprofen 600 mg every 6 hours for 5 days then as needed for pain  You may drive after 2  weeks You may walk up steps  You may shower  You may resume a regular diet  Keep incisions clean and dry Do not lift over 15 pounds for 6 weeks Avoid anything in vagina for 6 weeks (or until after your post-operative visit)

## 2020-03-24 NOTE — Op Note (Signed)
OPERATIVE NOTE  Lauramae Kneisley Traynham  DOB:    08-30-1970  MRN:    [id number]  CSN:    [id number]  Date of Surgery:  03/24/2020  Pre-Operative Diagnosis:  1. Abnormal uterine bleeding     2. Fibroid uterus and Adenomyosis     3. History of severe anemia  Postoperative diagnoses:  Same as above plus endometriosis  Procedures: Total abdominal hysterectomy, bilateral salpingectomy, cystoscopy  Surgeon: Dr. Sanjuana Kava MD  Assistant: Dr. Everett Graff MD  Anesthesia: General Endotracheal  Anesthesiologist: Dr. Belinda Block MD; Cleda Daub, CRNA  Specimen: uterus with cervix, bilateral fallopian tubes, fibroid  EBL: 400 mL  IVF: 1900 mL Crystalloid 310 mL or one unit pRBCs  UOP: 500 mL clear urine  Complications: none.  Operative findings: Enlarged globular appearing uterus with large anterior fibroid, normal appearing bilateral fallopian tubes and ovaries. Evidence of endometriosis (endometriotic implants) along pouch of Douglas and bilateral adnexal fossas. Normal appearing bladder on cystoscopy without injury. Brisk ureteral jets bilaterally.   Technique: Patient was placed in dorsal supine position and after adequate general anesthesia was achieved, the patient was prepped and draped in the usual sterile fashion after the foley had been placed. A time out was performed.  A Pfannenstiel incision was made with the scalpel and carried down to the fascia.  The fascia was incised with the scalpel in a transverse manner and extended in a transverse curvilinear manner.  The rectus muscles were split in the midline and a bowel free portion of the peritoneum was tented up.  It was entered into with the hemostat and extended in a superior and inferior manner with good visualization of the bowel and bladder. The Alexis retractor was placed in the abdomen and the  bowel was packed away. The uterus was brought out of the pelvis by holding each cornual end with large kelly clamps. The  right salpinx was clamped with the Ligasure impact and transected off the ovary. This was performed bilaterally.    The anterior large fibroid was identified and noted to be obstructing visualization, so a myomectomy was performed. It was then injected with vasopressin, 20 units mixed in 30 cc of normal saline along the serosal surface and careful to aspirate to avoid any blood vessels. 5 cc was injected. Next, the Bovie to cut the linear incision along the top of the fibroid until fibroid fibers were seen. The edges of the myometrium were held and blunt dissection around the fibroid using surgeon finger was done.  The fibroid was held with toothed towel clamped and easily twisted out and handed off the field to be put with rest of future specimen.   The round ligaments were clamped, cauterized and transected with the Ligasure Impact bilaterally. Metzenbaum scissors were used to incise a transverse curvilinear incision in the vesico-cervical fascia. The Bladder flap was created and the bladder mobilized downward. The ureters were visualized bilaterally retroperitoneally coursing well below the operative field.The uterine-ovarian ligament on the right side was double clamped with curved Heaney clamps and transected using the Ligasure.   Each pedicle was then secured with a suture ligation of 0- vicryl as the pedicles were large.  The Left uteroovarian ligaments were transected with the Ligasure impact and noted to be hemostatic without using suture. The bladder flap was then retracted inferiorly with blunt and cautery dissection below the external cervical os. and the uterine arteries were skeletonized.       The uterine arteries were clamped bilaterally  and  incised with the mayos.  Each pedicle was then secured with a stitch of 0-vicryl.  The uterine corpus was then incised off the cervix with the bovie with the fallopian tubes attached and handed off the field to be sent to Pathology. There were several  cervical bleeders that were clamped, cauterized or stitched for hemostasis. The cervix was held with straight Kocher clamps.  The uterosacral /cardinal ligament complex was sequentially clamped  with straight clamps transected with a scalpel and secured with 0-vicryl. This was done bilaterally.     At the level of the reflection of the vagina onto the cervix, two curved heaneys were placed and the cervix removed with the mayo scissors.  The cuff angles were then secured with 0-vicryl and the cuff was closed with interrupted figure of eight stitches of 0-vicryl.  Hemostasis was insured. Irrigation was performed using sterile water and hemostasis was assured. Doylene Bode was placed on the vaginal cuff and pedicles.   The instruments and laps were removed from the abdominal cavity and the peritoneum was closed with a running stitch of 2-chromic. Interrupted suture of 2-0 chromic re-approximated the rectus muscle.The fascia was closed with 0-vicryl in a running manner meeting from both ends in the middle. 50 mL of Expiril diluted with 0.5% marcaine was injected along each layer of the abdomen.   The subcutaneous tissue was closed with interrupted stitches of 2-0 plain gut after hemostasis was achieved with the bovie and irrigation.  The skin was closed with 4-0 vicryl with a keith needle. Benzoin, steri strips,  honeycomb and pressure bandage was placed.   Sponge, needle and instrument counts were correct x 3.  Patient placed in dorsal lithotomy position and a cystoscopy was performed. A 70 degree cystoscope was utilized and placed in the urethra after removal of the foley catheter. 300 mL of sterile normal saline was placed in the bladder and a complete bladder survey was performed. There was no noted injury to the bladder urothelium and there were brisk jets from both ureteral orifices.    Pt tolerated the procedure well and was transferred to the recovery room in stable condition.    Rogelio Seen Nesta Kimple

## 2020-03-24 NOTE — OR Nursing (Signed)
Pt is awake,alert and oriented.Pt and/or family verbalized understanding of poc and discharge instructions. Reviewed admission and on going care with receiving RN. Pt is in NAD at this time and is ready to be transferred to floor. Will con't to monitor until pt is transferred.

## 2020-03-25 ENCOUNTER — Encounter (HOSPITAL_COMMUNITY): Payer: Self-pay | Admitting: Obstetrics & Gynecology

## 2020-03-25 LAB — CBC
HCT: 32.3 % — ABNORMAL LOW (ref 36.0–46.0)
Hemoglobin: 10.5 g/dL — ABNORMAL LOW (ref 12.0–15.0)
MCH: 24.8 pg — ABNORMAL LOW (ref 26.0–34.0)
MCHC: 32.5 g/dL (ref 30.0–36.0)
MCV: 76.2 fL — ABNORMAL LOW (ref 80.0–100.0)
Platelets: 356 10*3/uL (ref 150–400)
RBC: 4.24 MIL/uL (ref 3.87–5.11)
RDW: 21.4 % — ABNORMAL HIGH (ref 11.5–15.5)
WBC: 15.9 10*3/uL — ABNORMAL HIGH (ref 4.0–10.5)
nRBC: 0 % (ref 0.0–0.2)

## 2020-03-25 LAB — BASIC METABOLIC PANEL
Anion gap: 8 (ref 5–15)
BUN: <5 mg/dL — ABNORMAL LOW (ref 6–20)
CO2: 22 mmol/L (ref 22–32)
Calcium: 8.9 mg/dL (ref 8.9–10.3)
Chloride: 107 mmol/L (ref 98–111)
Creatinine, Ser: 0.63 mg/dL (ref 0.44–1.00)
GFR, Estimated: >60 mL/min (ref >60)
Glucose, Bld: 101 mg/dL — ABNORMAL HIGH (ref 70–99)
Potassium: 4 mmol/L (ref 3.5–5.1)
Sodium: 137 mmol/L (ref 135–145)

## 2020-03-25 LAB — SURGICAL PATHOLOGY

## 2020-03-25 MED ORDER — SENNOSIDES-DOCUSATE SODIUM 8.6-50 MG PO TABS: 1.0000 | ORAL_TABLET | Freq: Every evening | ORAL | 3 refills | Status: DC | PRN

## 2020-03-25 MED ORDER — IBUPROFEN 800 MG PO TABS: 800.0000 mg | ORAL_TABLET | Freq: Four times a day (QID) | ORAL | 0 refills | Status: DC

## 2020-03-25 MED ORDER — HYDROCODONE-ACETAMINOPHEN 5-325 MG PO TABS: 1.0000 | ORAL_TABLET | Freq: Four times a day (QID) | ORAL | 0 refills | Status: DC | PRN

## 2020-03-25 NOTE — Progress Notes (Signed)
MD Rounding Note  1 Day Post-Op Procedure(s) (LRB): ABDOMINAL HYSTERECTOMY WITH BILATERAL SALPINGECTOMY (N/A) CYSTOSCOPY  Subjective: Patient feeling well and notes she is doing fine. She is without complaints.  Patient is controlled with oral medication.  She is ambulating without difficulty,  Voiding without difficulty and tolerating po without nausea or vomiting.  She denies fever or chills. She is passing flatus.    Objective: I have reviewed patient's vital signs, intake and output and labs.  General: alert, cooperative and no distress GI: soft, non-tender; bowel sounds normal; no masses,  no organomegaly and incision: intact and serous drainage present Extremities: extremities normal, atraumatic, no cyanosis or edema and Homans sign is negative, no sign of DVT  Assessment: Sharon Meyer is a very sweet 49 year old POD#1 s/p   ABDOMINAL HYSTERECTOMY WITH BILATERAL SALPINGECTOMY CYSTOSCOPY doing well.   Plan: Discussed with patient that as she is doing so well Will discharge her home today with close follow up in office next week.  Discussed discharge precautions to include fever, chills, nausea or vomiting,  Or if pain is not well controlled.  Will have RN change Honeycomb dressing prior to discharge.    LOS: 2 days    [facility] 03/25/2020, 3:14 PM

## 2020-03-25 NOTE — Discharge Summary (Signed)
Physician Discharge Summary  Patient ID: Sharon Meyer MRN: [id number] DOB/AGE: [birth year] 51 y.o.  Admit date: 03/23/2020 Discharge date: 03/25/2020  Admission Diagnoses: Severe Anemia, Abnormal uterine bleeding (menorrhagia), Fibroid uterus  Discharge Diagnoses:  Active Problems:   Symptomatic anemia   Menorrhagia s/p abdominal hysterectomy   Discharged Condition: good  Hospital Course: Patient admitted to Med/Surg floor for blood transfusion of 4 units of packed red blood cells for a critical hemoglobin of 4.7.  Patient completely asymptomatic.  The following day the patient was taken into the operating room for a total abdominal hysterectomy bilateral salpingectomy / cystoscopy.  There were no intraoperative or post operative complications.  The post operative recovery was expedient and on post operative day #1 the patient was meeting all discharge criteria, so decision was made to discharge home.    Consults: None  Significant Diagnostic Studies: labs: admission hb 4.7 post transfusion: 9.8 post operative Hb 10.2  Treatments: IV hydration, antibiotics: Ancef, analgesia: acetaminophen and Vicodin and surgery: TAH/ BS/ Cystoscopy  Discharge Exam: Blood pressure 109/65, pulse 69, temperature 98.6 F (37 C), temperature source Oral, resp. rate 20, height 5' 7.01" (1.702 m), weight 88.2 kg, SpO2 100 %. General: alert, cooperative and no distress GI: soft, non-tender; bowel sounds normal; no masses,  no organomegaly and incision: intact and serous drainage present Extremities: extremities normal, atraumatic, no cyanosis or edema and Homans sign is negative, no sign of DVT  Disposition: Discharge disposition: 01-Home or Self Care       Discharge Instructions     Remove dressing in 72 hours   Complete by: As directed    Call MD for:   Complete by: As directed    Abnormal foul smelling vaginal discharge or heavy vaginal bleeding (soaking a pad an hour)   Call MD  for:  difficulty breathing, headache or visual disturbances   Complete by: As directed    Call MD for:  extreme fatigue   Complete by: As directed    Call MD for:  hives   Complete by: As directed    Call MD for:  persistant dizziness or light-headedness   Complete by: As directed    Call MD for:  persistant nausea and vomiting   Complete by: As directed    Call MD for:  redness, tenderness, or signs of infection (pain, swelling, redness, odor or green/yellow discharge around incision site)   Complete by: As directed    Call MD for:  severe uncontrolled pain   Complete by: As directed    Call MD for:  temperature >100.4   Complete by: As directed    Diet - low sodium heart healthy   Complete by: As directed    Discharge instructions   Complete by: As directed    If you have not made a follow up visit please call [facility] office at [phone] or if you have any questions or concerns.  In case of emergency call Dr. Alwyn Pea [phone]   Driving Restrictions   Complete by: As directed    No driving for at least 10 to 14 days or while taking any narcotic   If the dressing is still on your incision site when you go home, remove it on the third day after your surgery date. Remove dressing if it begins to fall off, or if it is dirty or damaged before the third day.   Complete by: As directed    Increase activity slowly   Complete by: As directed  Lifting restrictions   Complete by: As directed    No heavy lifting, nothing heavier than a gallon a milk or approximately 20 pounds   Other Restrictions   Complete by: As directed    Showers only no baths, tubs, saunas or pools   Sexual Activity Restrictions   Complete by: As directed    Nothing in vagina for 4 to 6 weeks     Allergies as of 03/25/2020   No Known Allergies     Medication List    STOP taking these medications   medroxyPROGESTERone 10 MG tablet Commonly known as: PROVERA     TAKE these medications    ferrous sulfate 324 (65 Fe) MG Tbec Take 1 tablet (325 mg total) by mouth daily. What changed:   how much to take  when to take this   HYDROcodone-acetaminophen 5-325 MG tablet Commonly known as: NORCO/VICODIN Take 1-2 tablets by mouth every 6 (six) hours as needed for moderate pain.   ibuprofen 800 MG tablet Commonly known as: ADVIL Take 1 tablet (800 mg total) by mouth every 6 (six) hours.   senna-docusate 8.6-50 MG tablet Commonly known as: Senokot-S Take 1 tablet by mouth at bedtime as needed for mild constipation or moderate constipation.            Discharge Care Instructions  (From admission, onward)         Start     Ordered   03/25/20 0000  If the dressing is still on your incision site when you go home, remove it on the third day after your surgery date. Remove dressing if it begins to fall off, or if it is dirty or damaged before the third day.        03/25/20 1524          Follow-up Information    Sanjuana Kava, MD Follow up on 03/29/2020.   Specialty: Obstetrics and Gynecology Why: appointment time is 10:45 a.m. Contact information: [address] [place] [address] [phone]               Signed: Sanjuana Kava 03/25/2020, 3:24 PM

## 2020-03-25 NOTE — Progress Notes (Signed)
Sharon Meyer is a49 y.o.  [id number]  Post Op Date # 1: TAH/BS/Cystoscopy  Subjective: Patient is Doing well postoperatively. Patient has Pain is controlled with current analgesics. Medications being used: prescription NSAID's including Ketorolac 30 mg IV and narcotic analgesics including Oxycodone 5 mg. The patient is tolerating liquids, sitting up in chair and ambulating.  She still has her Foley so will have that discontinued.    Objective: Vital signs in last 24 hours: Temp:  [97.9 F (36.6 C)-98.7 F (37.1 C)] 98.7 F (37.1 C) (12/10 0430) Pulse Rate:  [73-96] 76 (12/10 0430) Resp:  [15-20] 20 (12/10 0430) BP: (115-130)/(68-78) 118/75 (12/10 0430) SpO2:  [98 %-100 %] 100 % (12/10 0430) Weight:  [88.2 kg] 88.2 kg (12/09 0826)  Intake/Output from previous day: 12/09 0701 - 12/10 0700 In: [place] [P.O.:[address]; I.V.:3765] Out: 3150 [Urine:2150] Intake/Output this shift: No intake/output data recorded. Recent Labs  Lab 03/22/20 1414 03/24/20 0351 03/25/20 0101  WBC 8.1  --  15.9*  HGB 4.9* 9.8* 10.5*  HCT 18.7* 30.2* 32.3*  PLT 392  --  356     Recent Labs  Lab 03/22/20 1414 03/25/20 0101  NA 138 137  K 4.1 4.0  CL 108 107  CO2 20* 22  BUN 8 <5*  CREATININE 0.65 0.63  CALCIUM 9.0 8.9  GLUCOSE 90 101*    EXAM: General:  the patient is alert, conversational and in no distress Abdomen: Pressure dressing is clean dry and intact Vaginal pad: clean   Assessment: s/p Procedure(s): ABDOMINAL HYSTERECTOMY WITH BILATERAL SALPINGECTOMY CYSTOSCOPY: stable, progressing well and anemia  Plan: Advance diet Discontinue Foley  Routine care  LOS: 2 days    Earnstine Regal, PA-C 03/25/2020 7:23 AM

## 2020-03-25 NOTE — Plan of Care (Signed)

## 2020-03-25 NOTE — Progress Notes (Signed)
Patient wound care was done before leaving.  Patient was given discharge instructions and stated understanding.

## 2020-03-26 LAB — TYPE AND SCREEN
ABO/RH(D): B POS
Antibody Screen: NEGATIVE
Unit division: 0
Unit division: 0
Unit division: 0
Unit division: 0
Unit division: 0
Unit division: 0

## 2020-03-26 LAB — BPAM RBC
Blood Product Expiration Date: 202112302359
Blood Product Expiration Date: 202201012359
Blood Product Expiration Date: 202201042359
Blood Product Expiration Date: 202201042359
Blood Product Expiration Date: 202201042359
Blood Product Expiration Date: 202201052359
ISSUE DATE / TIME: 202112081435
ISSUE DATE / TIME: 202112081737
ISSUE DATE / TIME: 202112082037
ISSUE DATE / TIME: 202112082311
ISSUE DATE / TIME: 202112090922
ISSUE DATE / TIME: 202112090922
Unit Type and Rh: 7300
Unit Type and Rh: 7300
Unit Type and Rh: 7300
Unit Type and Rh: 7300
Unit Type and Rh: 7300
Unit Type and Rh: 7300

## 2020-09-16 ENCOUNTER — Other Ambulatory Visit: Payer: Self-pay | Admitting: Internal Medicine

## 2020-09-16 DIAGNOSIS — D509 Iron deficiency anemia, unspecified: Secondary | ICD-10-CM

## 2020-09-16 DIAGNOSIS — N920 Excessive and frequent menstruation with regular cycle: Secondary | ICD-10-CM

## 2021-01-30 ENCOUNTER — Ambulatory Visit
Admission: EM | Admit: 2021-01-30 | Discharge: 2021-01-30 | Discharge status: Against Medical Advice | Payer: BC Managed Care – PPO

## 2021-01-30 ENCOUNTER — Other Ambulatory Visit: Payer: Self-pay

## 2021-01-31 ENCOUNTER — Ambulatory Visit: Payer: Self-pay

## 2021-02-03 ENCOUNTER — Encounter: Payer: Self-pay | Admitting: Family Medicine

## 2021-02-03 ENCOUNTER — Ambulatory Visit (INDEPENDENT_AMBULATORY_CARE_PROVIDER_SITE_OTHER): Payer: BC Managed Care – PPO | Admitting: Family Medicine

## 2021-02-03 ENCOUNTER — Other Ambulatory Visit: Payer: Self-pay

## 2021-02-03 VITALS — BP 122/78 | HR 67 | Temp 98.4°F | Resp 16 | Ht 67.0 in | Wt 192.6 lb

## 2021-02-03 DIAGNOSIS — Z23 Encounter for immunization: Secondary | ICD-10-CM | POA: Diagnosis not present

## 2021-02-03 DIAGNOSIS — R739 Hyperglycemia, unspecified: Secondary | ICD-10-CM | POA: Diagnosis not present

## 2021-02-03 DIAGNOSIS — D509 Iron deficiency anemia, unspecified: Secondary | ICD-10-CM | POA: Diagnosis not present

## 2021-02-03 NOTE — Progress Notes (Signed)
Established Patient Office Visit  Subjective:  Patient ID: Sharon Meyer, female    DOB: 1970/11/12  Age: 50 y.o. MRN: [id number]  CC:  Chief Complaint  Patient presents with   Annual Exam    HPI Ayisha Pol Kaufman presents for follow-up of recent lab work that was obtained during recent physical exam.  Past Medical History:  Diagnosis Date   IDA (iron deficiency anemia)    Intraductal papilloma of right breast    s/p right breast lumpectomy 08/09/14, 05/21/18   PONV (postoperative nausea and vomiting)     Past Surgical History:  Procedure Laterality Date   ABDOMINAL HYSTERECTOMY N/A 03/24/2020   Procedure: ABDOMINAL HYSTERECTOMY WITH BILATERAL SALPINGECTOMY;  Surgeon: Sanjuana Kava, MD;  Location: [facility];  Service: Gynecology;  Laterality: N/A;   BREAST EXCISIONAL BIOPSY Right 08/09/2014   BREAST LUMPECTOMY Right 05/21/2018   Procedure: RIGHT BREAST LUMPECTOMY;  Surgeon: Excell Seltzer, MD;  Location: [facility];  Service: General;  Laterality: Right;   BREAST LUMPECTOMY WITH RADIOACTIVE SEED LOCALIZATION Right 08/09/2014   Procedure: RIGHT BREAST LUMPECTOMY WITH RADIOACTIVE SEED LOCALIZATION;  Surgeon: Excell Seltzer, MD;  Location: [facility];  Service: General;  Laterality: Right;   BREAST SURGERY N/A    [employer] 11/10/2019   btl     CYSTOSCOPY  03/24/2020   Procedure: CYSTOSCOPY;  Surgeon: Sanjuana Kava, MD;  Location: [facility];  Service: Gynecology;;   HERNIA REPAIR     umbilical hernia    Family History  Problem Relation Age of Onset   Healthy Mother    Healthy Father     Social History   Socioeconomic History   Marital status: Single    Spouse name: Not on file   Number of children: Not on file   Years of education: Not on file   Highest education level: Not on file  Occupational History   Not on file  Tobacco Use   Smoking status: Never   Smokeless tobacco: Never  Vaping Use   Vaping Use: Never used   Substance and Sexual Activity   Alcohol use: No   Drug use: No   Sexual activity: Not Currently    Birth control/protection: Surgical  Other Topics Concern   Not on file  Social History Narrative   Not on file   Social Determinants of Health   Financial Resource Strain: Not on file  Food Insecurity: Not on file  Transportation Needs: Not on file  Physical Activity: Not on file  Stress: Not on file  Social Connections: Not on file  Intimate Partner Violence: Not on file    ROS Review of Systems  All other systems reviewed and are negative.  Objective:   Today's Vitals: BP 122/78   Pulse 67   Temp 98.4 F (36.9 C) (Oral)   Resp 16   Ht 5\' 7"  (1.702 m)   Wt 192 lb 9.6 oz (87.4 kg)   SpO2 98%   BMI 30.17 kg/m   Physical Exam Vitals and nursing note reviewed.  Constitutional:      General: She is not in acute distress. Cardiovascular:     Rate and Rhythm: Normal rate and regular rhythm.  Pulmonary:     Effort: Pulmonary effort is normal.     Breath sounds: Normal breath sounds.  Abdominal:     Palpations: Abdomen is soft.     Tenderness: There is no abdominal tenderness.  Neurological:     General: No focal deficit present.  Mental Status: She is alert and oriented to person, place, and time.    Assessment & Plan:   1. Iron deficiency anemia, unspecified iron deficiency anemia type Noted with recent lab work that patient CBC is now unremarkable/normal since recent hysterectomy.  2. Hyperglycemia Patient recent A1c was 5.8.  Discussed dietary and and activity options to decrease number  3. Need for shingles vaccine  - Varicella-zoster vaccine IM    Outpatient Encounter Medications as of 02/03/2021  Medication Sig   ferrous sulfate 324 (65 Fe) MG TBEC Take 1 tablet (324 mg total) by mouth at bedtime.   [DISCONTINUED] HYDROcodone-acetaminophen (NORCO/VICODIN) 5-325 MG tablet Take 1-2 tablets by mouth every 6 (six) hours as needed for moderate pain.    [DISCONTINUED] ibuprofen (ADVIL) 800 MG tablet Take 1 tablet (800 mg total) by mouth every 6 (six) hours.   [DISCONTINUED] senna-docusate (SENOKOT-S) 8.6-50 MG tablet Take 1 tablet by mouth at bedtime as needed for mild constipation or moderate constipation.   No facility-administered encounter medications on file as of 02/03/2021.    Follow-up: Return if symptoms worsen or fail to improve.   Becky Sax, MD

## 2021-04-24 ENCOUNTER — Ambulatory Visit: Payer: BC Managed Care – PPO

## 2021-05-05 ENCOUNTER — Other Ambulatory Visit: Payer: Self-pay

## 2021-05-05 ENCOUNTER — Ambulatory Visit (INDEPENDENT_AMBULATORY_CARE_PROVIDER_SITE_OTHER): Payer: BC Managed Care – PPO

## 2021-05-05 DIAGNOSIS — Z23 Encounter for immunization: Secondary | ICD-10-CM

## 2021-06-20 ENCOUNTER — Other Ambulatory Visit: Payer: Self-pay

## 2021-06-20 ENCOUNTER — Ambulatory Visit (INDEPENDENT_AMBULATORY_CARE_PROVIDER_SITE_OTHER): Payer: BC Managed Care – PPO | Admitting: Nurse Practitioner

## 2021-06-20 ENCOUNTER — Encounter: Payer: Self-pay | Admitting: Nurse Practitioner

## 2021-06-20 VITALS — BP 135/87 | HR 68 | Resp 18 | Ht 67.0 in | Wt 196.4 lb

## 2021-06-20 DIAGNOSIS — Z1211 Encounter for screening for malignant neoplasm of colon: Secondary | ICD-10-CM

## 2021-06-20 DIAGNOSIS — G44209 Tension-type headache, unspecified, not intractable: Secondary | ICD-10-CM

## 2021-06-20 MED ORDER — KETOROLAC TROMETHAMINE 15 MG/ML IJ SOLN
15.0000 mg | Freq: Once | INTRAMUSCULAR | Status: DC
Start: 2021-06-20 — End: 2021-06-20

## 2021-06-20 MED ORDER — KETOROLAC TROMETHAMINE 15 MG/ML IJ SOLN
15.0000 mg | Freq: Once | INTRAMUSCULAR | Status: AC
  Administered 2021-06-20: 15 mg via INTRAMUSCULAR

## 2021-06-20 MED ORDER — TIZANIDINE HCL 4 MG PO TABS: 4.0000 mg | ORAL_TABLET | Freq: Four times a day (QID) | ORAL | 0 refills | Status: DC | PRN

## 2021-06-20 MED ORDER — KETOROLAC TROMETHAMINE 15 MG/ML IJ SOLN: 15.0000 mg | Freq: Once | INTRAMUSCULAR | Status: DC

## 2021-06-20 MED ORDER — ASPIRIN-ACETAMINOPHEN-CAFFEINE 250-250-65 MG PO TABS
2.0000 | ORAL_TABLET | Freq: Four times a day (QID) | ORAL | 0 refills | Status: DC | PRN
Start: 2021-06-20 — End: 2022-10-02

## 2021-06-20 NOTE — Patient Instructions (Addendum)
1. Colon cancer screening ?Per patient request ? ?- Ambulatory referral to Gastroenterology ? ?2. Tension headache ? ?- ketorolac (TORADOL) 15 MG/ML injection; Inject 1 mL (15 mg total) into the muscle once for 1 dose.  Dispense: 1 mL ?- aspirin-acetaminophen-caffeine (EXCEDRIN MIGRAINE) 250-250-65 MG tablet; Take 2 tablets by mouth every 6 (six) hours as needed for headache.  Dispense: 30 tablet; Refill: 0 ?- tiZANidine (ZANAFLEX) 4 MG tablet; Take 1 tablet (4 mg total) by mouth every 6 (six) hours as needed for muscle spasms.  Dispense: 30 tablet; Refill: 0 ? ?Follow up: ? ?Follow up as needed - please go to the ED with worsening symptoms ? ?General Headache Without Cause ?A headache is pain or discomfort you feel around the head or neck area. There are many causes and types of headaches. In some cases, the cause may not be found. ?Follow these instructions at home: ?Watch your condition for any changes. Let your doctor know about them. Take these steps to help with your condition: ?Managing pain ?  ?Take over-the-counter and prescription medicines only as told by your doctor. This includes medicines for pain that are taken by mouth or put on the skin. ?Lie down in a dark, quiet room when you have a headache. ?If told, put ice on your head and neck area: ?Put ice in a plastic bag. ?Place a towel between your skin and the bag. ?Leave the ice on for 20 minutes, 2-3 times per day. ?Take off the ice if your skin turns bright red. This is very important. If you cannot feel pain, heat, or cold, you have a greater risk of damage to the area. ?If told, put heat on the affected area. Use the heat source that your doctor recommends, such as a moist heat pack or a heating pad. ?Place a towel between your skin and the heat source. ?Leave the heat on for 20-30 minutes. ?Take off the heat if your skin turns bright red. This is very important. If you cannot feel pain, heat, or cold, you have a greater risk of getting  burned. ?Keep lights dim if bright lights bother you or make your headaches worse. ?Eating and drinking ?Eat meals on a regular schedule. ?If you drink alcohol: ?Limit how much you have to: ?0-1 drink a day for women who are not pregnant. ?0-2 drinks a day for men. ?Know how much alcohol is in a drink. In the [place] one drink equals one 12 oz bottle of beer (355 mL), one 5 oz glass of wine (148 mL), or one 1? oz glass of hard liquor (44 mL). ?Stop drinking caffeine, or drink less caffeine. ?General instructions ? ?Keep a journal to find out if certain things bring on headaches. For example, write down: ?What you eat and drink. ?How much sleep you get. ?Any change to your diet or medicines. ?Get a massage or try other ways to relax. ?Limit stress. ?Sit up straight. Do not tighten (tense) your muscles. ?Do not smoke or use any products that contain nicotine or tobacco. If you need help quitting, ask your doctor. ?Exercise regularly as told by your doctor. ?Get enough sleep. This often means 7-9 hours of sleep each night. ?Keep all follow-up visits. This is important. ?Contact a doctor if: ?Medicine does not help your symptoms. ?You have a headache that feels different than the other headaches. ?You feel like you may vomit (nauseous) or you vomit. ?You have a fever. ?Get help right away if: ?Your headache: ?Gets very bad  quickly. ?Gets worse after a lot of physical activity. ?You have any of these symptoms: ?You continue to vomit. ?A stiff neck. ?Trouble seeing. ?Your eye or ear hurts. ?Trouble speaking. ?Weak muscles or you lose muscle control. ?You lose your balance or have trouble walking. ?You feel like you will pass out (faint) or you pass out. ?You are mixed up (confused). ?You have a seizure. ?These symptoms may be an emergency. Get help right away. Call your local emergency services (911 in the [place] ?Do not wait to see if the symptoms will go away. ?Do not drive yourself to the hospital. ?Summary ?A headache  is pain or discomfort that is felt around the head or neck area. ?There are many causes and types of headaches. In some cases, the cause may not be found. ?Keep a journal to help find out what causes your headaches. Watch your condition for any changes. Let your doctor know about them. ?Contact a doctor if you have a headache that is different from usual, or if medicine does not help your headache. ?Get help right away if your headache gets very bad, you throw up, you have trouble seeing, you lose your balance, or you have a seizure. ?This information is not intended to replace advice given to you by your health care provider. Make sure you discuss any questions you have with your health care provider. ?Document Revised: 08/31/2020 Document Reviewed: 08/31/2020 ?Elsevier Patient Education ? 2022 [place]. ? ? ?

## 2021-06-20 NOTE — Progress Notes (Signed)
Subjective:  ? ? Sharon Meyer is a 51 y.o. female who presents for evaluation of headache. Symptoms began about 5 days ago. Generally, the headaches last about several hours and occur several times per week. The headaches do not seem to be related to any time of the day. The headaches are usually pounding, squeezing, and throbbing and are located in right occital and right side of head.  The patient rates her most severe headaches a 9 on a scale from 1 to 10. Recently, the headaches have been stable. Work attendance or other daily activities are affected by the headaches. Precipitating factors include: stress. The headaches are usually not preceded by an aura. Associated neurologic symptoms: vision problems. The patient denies depression, loss of balance, muscle weakness, numbness of extremities, speech difficulties, and vomiting in the early morning. Home treatment has included acetaminophen with some improvement. Other history includes: nothing pertinent. Family history includes migraine headaches in brother. ? ? ? ?Review of Systems ?Review of Systems  ?Constitutional: Negative.   ?HENT: Negative.    ?Eyes:  Negative for photophobia and discharge.  ?Neurological:  Positive for headaches.  ? ?  ?Objective:  ? ?  ?Vitals with BMI 02/03/2021 03/25/2020 03/25/2020  ?Height '5\' 7"'$  - -  ?Weight 192 lbs 10 oz - -  ?BMI 30.16 - -  ?Systolic 505 397 673  ?Diastolic 78 65 75  ?Pulse 67 69 76  ? ?Physical Exam ?Constitutional:   ?   General: She is not in acute distress. ?Cardiovascular:  ?   Rate and Rhythm: Normal rate and regular rhythm.  ?Pulmonary:  ?   Effort: Pulmonary effort is normal.  ?   Breath sounds: Normal breath sounds.  ?Skin: ?   General: Skin is warm and dry.  ?Neurological:  ?   Mental Status: She is alert and oriented to person, place, and time.  ?Psychiatric:     ?   Mood and Affect: Affect normal.  ? ? ?  ?Assessment:  ? ? Colon cancer screening - Plan: Ambulatory referral to  Gastroenterology ? ?Tension headache - Plan: aspirin-acetaminophen-caffeine (EXCEDRIN MIGRAINE) 250-250-65 MG tablet, tiZANidine (ZANAFLEX) 4 MG tablet, ketorolac (TORADOL) 15 MG/ML injection 15 mg, DISCONTINUED: ketorolac (TORADOL) 15 MG/ML injection, DISCONTINUED: ketorolac (TORADOL) 15 MG/ML injection 15 mg ? ? ?  ?Plan:  ? ? Lie in darkened room and apply cold packs as needed for pain. ?Side effect profile discussed in detail. ?Asked to keep headache diary. ?Patient reassured that neurodiagnostic workup not indicated from benign H&P.  ? ?Patient Instructions  ?1. Colon cancer screening ?Per patient request ? ?- Ambulatory referral to Gastroenterology ? ?2. Tension headache ? ?- ketorolac (TORADOL) 15 MG/ML injection; Inject 1 mL (15 mg total) into the muscle once for 1 dose.  Dispense: 1 mL ?- aspirin-acetaminophen-caffeine (EXCEDRIN MIGRAINE) 250-250-65 MG tablet; Take 2 tablets by mouth every 6 (six) hours as needed for headache.  Dispense: 30 tablet; Refill: 0 ?- tiZANidine (ZANAFLEX) 4 MG tablet; Take 1 tablet (4 mg total) by mouth every 6 (six) hours as needed for muscle spasms.  Dispense: 30 tablet; Refill: 0 ? ?Follow up: ? ?Follow up as needed - please go to the ED with worsening symptoms ? ?General Headache Without Cause ?A headache is pain or discomfort you feel around the head or neck area. There are many causes and types of headaches. In some cases, the cause may not be found. ?Follow these instructions at home: ?Watch your condition for any changes. Let your  doctor know about them. Take these steps to help with your condition: ?Managing pain ?  ?Take over-the-counter and prescription medicines only as told by your doctor. This includes medicines for pain that are taken by mouth or put on the skin. ?Lie down in a dark, quiet room when you have a headache. ?If told, put ice on your head and neck area: ?Put ice in a plastic bag. ?Place a towel between your skin and the bag. ?Leave the ice on for 20  minutes, 2-3 times per day. ?Take off the ice if your skin turns bright red. This is very important. If you cannot feel pain, heat, or cold, you have a greater risk of damage to the area. ?If told, put heat on the affected area. Use the heat source that your doctor recommends, such as a moist heat pack or a heating pad. ?Place a towel between your skin and the heat source. ?Leave the heat on for 20-30 minutes. ?Take off the heat if your skin turns bright red. This is very important. If you cannot feel pain, heat, or cold, you have a greater risk of getting burned. ?Keep lights dim if bright lights bother you or make your headaches worse. ?Eating and drinking ?Eat meals on a regular schedule. ?If you drink alcohol: ?Limit how much you have to: ?0-1 drink a day for women who are not pregnant. ?0-2 drinks a day for men. ?Know how much alcohol is in a drink. In the [place] one drink equals one 12 oz bottle of beer (355 mL), one 5 oz glass of wine (148 mL), or one 1? oz glass of hard liquor (44 mL). ?Stop drinking caffeine, or drink less caffeine. ?General instructions ? ?Keep a journal to find out if certain things bring on headaches. For example, write down: ?What you eat and drink. ?How much sleep you get. ?Any change to your diet or medicines. ?Get a massage or try other ways to relax. ?Limit stress. ?Sit up straight. Do not tighten (tense) your muscles. ?Do not smoke or use any products that contain nicotine or tobacco. If you need help quitting, ask your doctor. ?Exercise regularly as told by your doctor. ?Get enough sleep. This often means 7-9 hours of sleep each night. ?Keep all follow-up visits. This is important. ?Contact a doctor if: ?Medicine does not help your symptoms. ?You have a headache that feels different than the other headaches. ?You feel like you may vomit (nauseous) or you vomit. ?You have a fever. ?Get help right away if: ?Your headache: ?Gets very bad quickly. ?Gets worse after a lot of physical  activity. ?You have any of these symptoms: ?You continue to vomit. ?A stiff neck. ?Trouble seeing. ?Your eye or ear hurts. ?Trouble speaking. ?Weak muscles or you lose muscle control. ?You lose your balance or have trouble walking. ?You feel like you will pass out (faint) or you pass out. ?You are mixed up (confused). ?You have a seizure. ?These symptoms may be an emergency. Get help right away. Call your local emergency services (911 in the [place] ?Do not wait to see if the symptoms will go away. ?Do not drive yourself to the hospital. ?Summary ?A headache is pain or discomfort that is felt around the head or neck area. ?There are many causes and types of headaches. In some cases, the cause may not be found. ?Keep a journal to help find out what causes your headaches. Watch your condition for any changes. Let your doctor know about them. ?Contact  a doctor if you have a headache that is different from usual, or if medicine does not help your headache. ?Get help right away if your headache gets very bad, you throw up, you have trouble seeing, you lose your balance, or you have a seizure. ?This information is not intended to replace advice given to you by your health care provider. Make sure you discuss any questions you have with your health care provider. ?Document Revised: 08/31/2020 Document Reviewed: 08/31/2020 ?Elsevier Patient Education ? 2022 [place]. ? ? ? ?

## 2021-12-05 ENCOUNTER — Ambulatory Visit
Admission: RE | Admit: 2021-12-05 | Discharge: 2021-12-05 | Disposition: A | Discharge status: Home / Self Care | Payer: BC Managed Care – PPO | Source: Ambulatory Visit | Attending: Emergency Medicine | Admitting: Emergency Medicine

## 2021-12-05 VITALS — BP 115/87 | HR 88 | Temp 98.6°F | Resp 18

## 2021-12-05 DIAGNOSIS — J309 Allergic rhinitis, unspecified: Secondary | ICD-10-CM

## 2021-12-05 MED ORDER — FLUTICASONE PROPIONATE 50 MCG/ACT NA SUSP: 1.0000 | Freq: Every day | NASAL | 2 refills | Status: DC

## 2021-12-05 MED ORDER — CETIRIZINE HCL 10 MG PO TABS: 10.0000 mg | ORAL_TABLET | Freq: Every day | ORAL | 2 refills | Status: DC

## 2021-12-05 NOTE — Discharge Instructions (Signed)
Your symptoms and my physical exam findings are concerning for exacerbation of your underlying allergies.    To avoid catching frequent respiratory infections, having skin reactions, dealing with eye irritation, losing sleep, missing work, etc., due to uncontrolled allergies, it is important that you begin/continue your allergy regimen and are consistent with taking your meds exactly as prescribed.   Please see the list below for recommended medications, dosages and frequencies to provide relief of current symptoms:     Zyrtec (cetirizine): This is an excellent second-generation antihistamine that helps to reduce respiratory inflammatory response to environmental allergens.  In some patients, this medication can cause daytime sleepiness so I recommend that you take 1 tablet daily at bedtime.     Flonase (fluticasone): This is a steroid nasal spray that you use once daily, 1 spray in each nare.  This medication does not work well if you decide to use it only used as you feel you need to, it works best used on a daily basis.  After 3 to 5 days of use, you will notice significant reduction of the inflammation and mucus production that is currently being caused by exposure to allergens, whether seasonal or environmental.  The most common side effect of this medication is nosebleeds.  If you experience a nosebleed, please discontinue use for 1 week, then feel free to resume.  I have provided you with a prescription.     If your insurance will not cover your allergy medications, please consider downloading the [employer] app which is free.  You can find considerable discounts on prescription and over-the-counter medications.   If you find that you have not had improvement of your symptoms in the next 5 to 7 days, please follow-up with your primary care provider or return here to urgent care for repeat evaluation and further recommendations.   Thank you for visiting urgent care today.  We appreciate the  opportunity to participate in your care.

## 2021-12-05 NOTE — ED Provider Notes (Signed)
[facility]    CSN: [id number] Arrival date & time: 12/05/21  9767    HISTORY   Chief Complaint  Patient presents with   Cough    Tightness in the chest and congestion - Entered by patient   Hoarse   Nasal Congestion   Shortness of Breath   HPI Sharon Meyer is a pleasant, 51 y.o. female who presents to urgent care today. Patient complains of nasal congestion, clear nasal drainage, postnasal drip, hoarseness of voice worse in the morning, feeling like she needs to cough up phlegm which makes it difficult for her to breathe if she does not.  Patient states her symptoms began 3 days ago.  Patient states has not tried any over-the-counter medications or home remedies for her symptoms.  Patient reports a history of allergies, usually in the spring.  Patient states she has not tried any allergy medication at this time.  The history is provided by the patient.   Past Medical History:  Diagnosis Date   IDA (iron deficiency anemia)    Intraductal papilloma of right breast    s/p right breast lumpectomy 08/09/14, 05/21/18   PONV (postoperative nausea and vomiting)    Patient Active Problem List   Diagnosis Date Noted   Menorrhagia 03/23/2020   Menorrhagia with regular cycle 11/19/2019   Anemia 11/17/2019   Iron deficiency anemia 07/11/2018   Symptomatic anemia 07/10/2018   Abnormality of breast on screening mammography 01/30/2018   Papilloma of breast right  01/30/2018   Past Surgical History:  Procedure Laterality Date   ABDOMINAL HYSTERECTOMY N/A 03/24/2020   Procedure: ABDOMINAL HYSTERECTOMY WITH BILATERAL SALPINGECTOMY;  Surgeon: Sanjuana Kava, MD;  Location: [place];  Service: Gynecology;  Laterality: N/A;   BREAST EXCISIONAL BIOPSY Right 08/09/2014   BREAST LUMPECTOMY Right 05/21/2018   Procedure: RIGHT BREAST LUMPECTOMY;  Surgeon: Excell Seltzer, MD;  Location: [place];  Service: General;  Laterality: Right;   BREAST LUMPECTOMY WITH  RADIOACTIVE SEED LOCALIZATION Right 08/09/2014   Procedure: RIGHT BREAST LUMPECTOMY WITH RADIOACTIVE SEED LOCALIZATION;  Surgeon: Excell Seltzer, MD;  Location: [place];  Service: General;  Laterality: Right;   BREAST SURGERY N/A    Phreesia 11/10/2019   btl     CYSTOSCOPY  03/24/2020   Procedure: CYSTOSCOPY;  Surgeon: Sanjuana Kava, MD;  Location: [place];  Service: Gynecology;;   HERNIA REPAIR     umbilical hernia   OB History   No obstetric history on file.    Home Medications    Prior to Admission medications   Medication Sig Start Date End Date Taking? Authorizing Provider  aspirin-acetaminophen-caffeine (EXCEDRIN MIGRAINE) [phone] MG tablet Take 2 tablets by mouth every 6 (six) hours as needed for headache. 06/20/21   Fenton Foy, NP  tiZANidine (ZANAFLEX) 4 MG tablet Take 1 tablet (4 mg total) by mouth every 6 (six) hours as needed for muscle spasms. 06/20/21   Fenton Foy, NP    Family History Family History  Problem Relation Age of Onset   Healthy Mother    Healthy Father    Social History Social History   Tobacco Use   Smoking status: Never   Smokeless tobacco: Never  Vaping Use   Vaping Use: Never used  Substance Use Topics   Alcohol use: No   Drug use: No   Allergies   Patient has no known allergies.  Review of Systems Review of Systems Pertinent findings revealed after performing a 14 point review  of systems has been noted in the history of present illness.  Physical Exam Triage Vital Signs ED Triage Vitals  Enc Vitals Group     BP 02/10/21 0827 (!) 147/82     Pulse Rate 02/10/21 0827 72     Resp 02/10/21 0827 18     Temp 02/10/21 0827 98.3 F (36.8 C)     Temp Source 02/10/21 0827 Oral     SpO2 02/10/21 0827 98 %     Weight --      Height --      Head Circumference --      Peak Flow --      Pain Score 02/10/21 0826 5     Pain Loc --      Pain Edu? --      Excl. in [place]? --   No data found.  Updated Vital Signs BP  115/87 (BP Location: Left Arm)   Pulse 88   Temp 98.6 F (37 C) (Oral)   Resp 18   LMP 11/13/2019 (Approximate)   SpO2 98%   Physical Exam Vitals and nursing note reviewed.  Constitutional:      General: She is not in acute distress.    Appearance: Normal appearance. She is not ill-appearing.  HENT:     Head: Normocephalic and atraumatic.     Salivary Glands: Right salivary gland is not diffusely enlarged or tender. Left salivary gland is not diffusely enlarged or tender.     Right Ear: Ear canal and external ear normal. No drainage. No middle ear effusion. There is no impacted cerumen. Tympanic membrane is bulging. Tympanic membrane is not injected or erythematous.     Left Ear: Ear canal and external ear normal. No drainage.  No middle ear effusion. There is no impacted cerumen. Tympanic membrane is bulging. Tympanic membrane is not injected or erythematous.     Ears:     Comments: Bilateral EACs normal, both TMs bulging with clear fluid    Nose: Rhinorrhea present. No nasal deformity, septal deviation, signs of injury, nasal tenderness, mucosal edema or congestion. Rhinorrhea is clear.     Right Nostril: Occlusion present. No foreign body, epistaxis or septal hematoma.     Left Nostril: Occlusion present. No foreign body, epistaxis or septal hematoma.     Right Turbinates: Enlarged, swollen and pale.     Left Turbinates: Enlarged, swollen and pale.     Right Sinus: No maxillary sinus tenderness or frontal sinus tenderness.     Left Sinus: No maxillary sinus tenderness or frontal sinus tenderness.     Mouth/Throat:     Lips: Pink. No lesions.     Mouth: Mucous membranes are moist. No oral lesions.     Pharynx: Oropharynx is clear. Uvula midline. No posterior oropharyngeal erythema or uvula swelling.     Tonsils: No tonsillar exudate. 0 on the right. 0 on the left.     Comments: Postnasal drip Eyes:     General: Lids are normal.        Right eye: No discharge.        Left eye: No  discharge.     Extraocular Movements: Extraocular movements intact.     Conjunctiva/sclera: Conjunctivae normal.     Right eye: Right conjunctiva is not injected.     Left eye: Left conjunctiva is not injected.  Neck:     Trachea: Trachea and phonation normal.  Cardiovascular:     Rate and Rhythm: Normal rate and regular rhythm.  Pulses: Normal pulses.     Heart sounds: Normal heart sounds. No murmur heard.    No friction rub. No gallop.  Pulmonary:     Effort: Pulmonary effort is normal. No accessory muscle usage, prolonged expiration or respiratory distress.     Breath sounds: Normal breath sounds. No stridor, decreased air movement or transmitted upper airway sounds. No decreased breath sounds, wheezing, rhonchi or rales.  Chest:     Chest wall: No tenderness.  Musculoskeletal:        General: Normal range of motion.     Cervical back: Normal range of motion and neck supple. Normal range of motion.  Lymphadenopathy:     Cervical: No cervical adenopathy.  Skin:    General: Skin is warm and dry.     Findings: No erythema or rash.  Neurological:     General: No focal deficit present.     Mental Status: She is alert and oriented to person, place, and time.  Psychiatric:        Mood and Affect: Mood normal.        Behavior: Behavior normal.     Visual Acuity Right Eye Distance:   Left Eye Distance:   Bilateral Distance:    Right Eye Near:   Left Eye Near:    Bilateral Near:     UC Couse / Diagnostics / Procedures:     Radiology No results found.  Procedures Procedures (including critical care time) EKG  Pending results:  Labs Reviewed - No data to display  Medications Ordered in UC: Medications - No data to display  UC Diagnoses / Final Clinical Impressions(s)   I have reviewed the triage vital signs and the nursing notes.  Pertinent labs & imaging results that were available during my care of the patient were reviewed by me and considered in my medical  decision making (see chart for details).    Final diagnoses:  Allergic rhinitis, unspecified seasonality, unspecified trigger   Patient advised to begin allergy medications.  Return precautions advised.  ED Prescriptions     Medication Sig Dispense Auth. Provider   cetirizine (ZYRTEC ALLERGY) 10 MG tablet Take 1 tablet (10 mg total) by mouth at bedtime. 30 tablet Lynden Oxford Scales, PA-C   fluticasone (FLONASE) 50 MCG/ACT nasal spray Place 1 spray into both nostrils daily. Begin by using 2 sprays in each nare daily for 3 to 5 days, then decrease to 1 spray in each nare daily. 15.8 mL Lynden Oxford Scales, PA-C      PDMP not reviewed this encounter.  Disposition Upon Discharge:  Condition: stable for discharge home Home: take medications as prescribed; routine discharge instructions as discussed; follow up as advised.  Patient presented with an acute illness with associated systemic symptoms and significant discomfort requiring urgent management. In my opinion, this is a condition that a prudent lay person (someone who possesses an average knowledge of health and medicine) may potentially expect to result in complications if not addressed urgently such as respiratory distress, impairment of bodily function or dysfunction of bodily organs.   Routine symptom specific, illness specific and/or disease specific instructions were discussed with the patient and/or caregiver at length.   As such, the patient has been evaluated and assessed, work-up was performed and treatment was provided in alignment with urgent care protocols and evidence based medicine.  Patient/parent/caregiver has been advised that the patient may require follow up for further testing and treatment if the symptoms continue in spite of treatment, as  clinically indicated and appropriate.  If the patient was tested for COVID-19, Influenza and/or RSV, then the patient/parent/guardian was advised to isolate at home pending  the results of his/her diagnostic coronavirus test and potentially longer if they're positive. I have also advised pt that if his/her COVID-19 test returns positive, it's recommended to self-isolate for at least 10 days after symptoms first appeared AND until fever-free for 24 hours without fever reducer AND other symptoms have improved or resolved. Discussed self-isolation recommendations as well as instructions for household member/close contacts as per the [facility] and [place] DHHS, and also gave patient the [place] packet with this information.  Patient/parent/caregiver has been advised to return to the [facility] or PCP in 3-5 days if no better; to PCP or the Emergency Department if new signs and symptoms develop, or if the current signs or symptoms continue to change or worsen for further workup, evaluation and treatment as clinically indicated and appropriate  The patient will follow up with their current PCP if and as advised. If the patient does not currently have a PCP we will assist them in obtaining one.   The patient may need specialty follow up if the symptoms continue, in spite of conservative treatment and management, for further workup, evaluation, consultation and treatment as clinically indicated and appropriate.  Patient/parent/caregiver verbalized understanding and agreement of plan as discussed.  All questions were addressed during visit.  Please see discharge instructions below for further details of plan.  Discharge Instructions:   Discharge Instructions      Your symptoms and my physical exam findings are concerning for exacerbation of your underlying allergies.    To avoid catching frequent respiratory infections, having skin reactions, dealing with eye irritation, losing sleep, missing work, etc., due to uncontrolled allergies, it is important that you begin/continue your allergy regimen and are consistent with taking your meds exactly as prescribed.   Please see the list below for  recommended medications, dosages and frequencies to provide relief of current symptoms:     Zyrtec (cetirizine): This is an excellent second-generation antihistamine that helps to reduce respiratory inflammatory response to environmental allergens.  In some patients, this medication can cause daytime sleepiness so I recommend that you take 1 tablet daily at bedtime.     Flonase (fluticasone): This is a steroid nasal spray that you use once daily, 1 spray in each nare.  This medication does not work well if you decide to use it only used as you feel you need to, it works best used on a daily basis.  After 3 to 5 days of use, you will notice significant reduction of the inflammation and mucus production that is currently being caused by exposure to allergens, whether seasonal or environmental.  The most common side effect of this medication is nosebleeds.  If you experience a nosebleed, please discontinue use for 1 week, then feel free to resume.  I have provided you with a prescription.     If your insurance will not cover your allergy medications, please consider downloading the [employer] app which is free.  You can find considerable discounts on prescription and over-the-counter medications.   If you find that you have not had improvement of your symptoms in the next 5 to 7 days, please follow-up with your primary care provider or return here to urgent care for repeat evaluation and further recommendations.   Thank you for visiting urgent care today.  We appreciate the opportunity to participate in your care.  This office note has been dictated using Museum/gallery curator.  Unfortunately, this method of dictation can sometimes lead to typographical or grammatical errors.  I apologize for your inconvenience in advance if this occurs.  Please do not hesitate to reach out to me if clarification is needed.      Lynden Oxford Scales, [place] 12/08/21 [phone]

## 2021-12-05 NOTE — ED Triage Notes (Signed)
The pt c/o congestion, SOB and hoarseness that began Saturday.  Home interventions: none

## 2021-12-26 ENCOUNTER — Encounter: Payer: Self-pay | Admitting: Family Medicine

## 2021-12-26 ENCOUNTER — Ambulatory Visit (INDEPENDENT_AMBULATORY_CARE_PROVIDER_SITE_OTHER): Payer: BC Managed Care – PPO | Admitting: Family Medicine

## 2021-12-26 VITALS — BP 130/85 | HR 63 | Temp 98.1°F | Resp 16 | Ht 67.0 in | Wt 200.0 lb

## 2021-12-26 DIAGNOSIS — D509 Iron deficiency anemia, unspecified: Secondary | ICD-10-CM | POA: Diagnosis not present

## 2021-12-27 ENCOUNTER — Encounter: Payer: Self-pay | Admitting: Family Medicine

## 2021-12-27 LAB — CBC WITH DIFFERENTIAL/PLATELET
Basophils Absolute: 0.1 10*3/uL (ref 0.0–0.2)
Basos: 1 %
EOS (ABSOLUTE): 0.1 10*3/uL (ref 0.0–0.4)
Eos: 2 %
Hematocrit: 39 % (ref 34.0–46.6)
Hemoglobin: 13.1 g/dL (ref 11.1–15.9)
Immature Grans (Abs): 0 10*3/uL (ref 0.0–0.1)
Immature Granulocytes: 0 %
Lymphocytes Absolute: 1.8 10*3/uL (ref 0.7–3.1)
Lymphs: 30 %
MCH: 29.4 pg (ref 26.6–33.0)
MCHC: 33.6 g/dL (ref 31.5–35.7)
MCV: 87 fL (ref 79–97)
Monocytes Absolute: 0.4 10*3/uL (ref 0.1–0.9)
Monocytes: 7 %
Neutrophils Absolute: 3.7 10*3/uL (ref 1.4–7.0)
Neutrophils: 60 %
Platelets: 295 10*3/uL (ref 150–450)
RBC: 4.46 x10E6/uL (ref 3.77–5.28)
RDW: 13.7 % (ref 11.7–15.4)
WBC: 6 10*3/uL (ref 3.4–10.8)

## 2021-12-27 LAB — IRON: Iron: 67 ug/dL (ref 27–159)

## 2021-12-27 NOTE — Progress Notes (Signed)
Established Patient Office Visit  Subjective    Patient ID: Sharon Meyer, female    DOB: 01/15/1971  Age: 51 y.o. MRN: [id number]  CC:  Chief Complaint  Patient presents with   Follow-up    HPI [name] presents for follow up of anemia. Patient has not been taking iron supplements lately.    Outpatient Encounter Medications as of 12/26/2021  Medication Sig   aspirin-acetaminophen-caffeine (EXCEDRIN MIGRAINE) 250-250-65 MG tablet Take 2 tablets by mouth every 6 (six) hours as needed for headache.   cetirizine (ZYRTEC ALLERGY) 10 MG tablet Take 1 tablet (10 mg total) by mouth at bedtime.   fluticasone (FLONASE) 50 MCG/ACT nasal spray Place 1 spray into both nostrils daily. Begin by using 2 sprays in each nare daily for 3 to 5 days, then decrease to 1 spray in each nare daily.   [DISCONTINUED] tiZANidine (ZANAFLEX) 4 MG tablet Take 1 tablet (4 mg total) by mouth every 6 (six) hours as needed for muscle spasms.   No facility-administered encounter medications on file as of 12/26/2021.    Past Medical History:  Diagnosis Date   IDA (iron deficiency anemia)    Intraductal papilloma of right breast    s/p right breast lumpectomy 08/09/14, 05/21/18   PONV (postoperative nausea and vomiting)     Past Surgical History:  Procedure Laterality Date   ABDOMINAL HYSTERECTOMY N/A 03/24/2020   Procedure: ABDOMINAL HYSTERECTOMY WITH BILATERAL SALPINGECTOMY;  Surgeon: Sanjuana Kava, MD;  Location: [place];  Service: Gynecology;  Laterality: N/A;   BREAST EXCISIONAL BIOPSY Right 08/09/2014   BREAST LUMPECTOMY Right 05/21/2018   Procedure: RIGHT BREAST LUMPECTOMY;  Surgeon: Excell Seltzer, MD;  Location: [place];  Service: General;  Laterality: Right;   BREAST LUMPECTOMY WITH RADIOACTIVE SEED LOCALIZATION Right 08/09/2014   Procedure: RIGHT BREAST LUMPECTOMY WITH RADIOACTIVE SEED LOCALIZATION;  Surgeon: Excell Seltzer, MD;  Location: [place];  Service: General;  Laterality: Right;   BREAST SURGERY N/A    Phreesia 11/10/2019   btl     CYSTOSCOPY  03/24/2020   Procedure: CYSTOSCOPY;  Surgeon: Sanjuana Kava, MD;  [facility];  Service: Gynecology;;   HERNIA REPAIR     umbilical hernia    Family History  Problem Relation Age of Onset   Healthy Mother    Healthy Father     Social History   Socioeconomic History   Marital status: Single    Spouse name: Not on file   Number of children: Not on file   Years of education: Not on file   Highest education level: Not on file  Occupational History   Not on file  Tobacco Use   Smoking status: Never   Smokeless tobacco: Never  Vaping Use   Vaping Use: Never used  Substance and Sexual Activity   Alcohol use: No   Drug use: No   Sexual activity: Not Currently    Birth control/protection: Surgical  Other Topics Concern   Not on file  Social History Narrative   Not on file   Social Determinants of Health   Financial Resource Strain: Not on file  Food Insecurity: Not on file  Transportation Needs: Not on file  Physical Activity: Not on file  Stress: Not on file  Social Connections: Not on file  Intimate Partner Violence: Not on file    Review of Systems  All other systems reviewed and are negative.       Objective  BP 130/85   Pulse 63   Temp 98.1 F (36.7 C) (Oral)   Resp 16   Ht '5\' 7"'$  (1.702 m)   Wt 200 lb (90.7 kg)   LMP 11/13/2019 (Approximate)   SpO2 96%   BMI 31.32 kg/m   Physical Exam Vitals and nursing note reviewed.  Constitutional:      General: She is not in acute distress. Cardiovascular:     Rate and Rhythm: Normal rate and regular rhythm.  Pulmonary:     Effort: Pulmonary effort is normal.     Breath sounds: Normal breath sounds.  Abdominal:     Palpations: Abdomen is soft.     Tenderness: There is no abdominal tenderness.  Neurological:     General: No focal deficit present.     Mental Status: She is alert and  oriented to person, place, and time.         Assessment & Plan:   1. Iron deficiency anemia, unspecified iron deficiency anemia type Monitoring labs ordered.  - CBC with Differential - Iron    No follow-ups on file.   Becky Sax, MD

## 2022-01-08 DIAGNOSIS — Z780 Asymptomatic menopausal state: Secondary | ICD-10-CM | POA: Diagnosis not present

## 2022-01-08 DIAGNOSIS — Z6829 Body mass index (BMI) 29.0-29.9, adult: Secondary | ICD-10-CM | POA: Diagnosis not present

## 2022-01-08 DIAGNOSIS — Z01419 Encounter for gynecological examination (general) (routine) without abnormal findings: Secondary | ICD-10-CM | POA: Diagnosis not present

## 2022-03-07 ENCOUNTER — Telehealth: Payer: BC Managed Care – PPO | Admitting: Physician Assistant

## 2022-03-07 DIAGNOSIS — R3989 Other symptoms and signs involving the genitourinary system: Secondary | ICD-10-CM | POA: Diagnosis not present

## 2022-03-07 MED ORDER — CEPHALEXIN 500 MG PO CAPS: 500.0000 mg | ORAL_CAPSULE | Freq: Two times a day (BID) | ORAL | 0 refills | Status: DC

## 2022-03-07 NOTE — Patient Instructions (Signed)
Sherlene Shams Giambra, thank you for joining Mar Daring, PA-C for today's virtual visit.  While this provider is not your primary care provider (PCP), if your PCP is located in our provider database this encounter information will be shared with them immediately following your visit.   [place] account gives you access to today's visit and all your visits, tests, and labs performed at [facility] " click here if you don't have a [facility] account or go to [email or web address]  Consent: (Patient) Sherlene Shams Grunow provided verbal consent for this virtual visit at the beginning of the encounter.  Current Medications:  Current Outpatient Medications:    cephALEXin (KEFLEX) 500 MG capsule, Take 1 capsule (500 mg total) by mouth 2 (two) times daily., Disp: 14 capsule, Rfl: 0   aspirin-acetaminophen-caffeine (EXCEDRIN MIGRAINE) 250-250-65 MG tablet, Take 2 tablets by mouth every 6 (six) hours as needed for headache., Disp: 30 tablet, Rfl: 0   cetirizine (ZYRTEC ALLERGY) 10 MG tablet, Take 1 tablet (10 mg total) by mouth at bedtime., Disp: 30 tablet, Rfl: 2   fluticasone (FLONASE) 50 MCG/ACT nasal spray, Place 1 spray into both nostrils daily. Begin by using 2 sprays in each nare daily for 3 to 5 days, then decrease to 1 spray in each nare daily., Disp: 15.8 mL, Rfl: 2   Medications ordered in this encounter:  Meds ordered this encounter  Medications   cephALEXin (KEFLEX) 500 MG capsule    Sig: Take 1 capsule (500 mg total) by mouth 2 (two) times daily.    Dispense:  14 capsule    Refill:  0    Order Specific Question:   Supervising Provider    Answer:   Chase Picket [id number]     *If you need refills on other medications prior to your next appointment, please contact your pharmacy*  Follow-Up: Call back or seek an in-person evaluation if the symptoms worsen or if the condition fails to improve as anticipated.  [place] [phone]  Other Instructions Sinus Infection, Adult A sinus infection, also called sinusitis, is inflammation of your sinuses. Sinuses are hollow spaces in the bones around your face. Your sinuses are located: Around your eyes. In the middle of your forehead. Behind your nose. In your cheekbones. Mucus normally drains out of your sinuses. When your nasal tissues become inflamed or swollen, mucus can become trapped or blocked. This allows bacteria, viruses, and fungi to grow, which leads to infection. Most infections of the sinuses are caused by a virus. A sinus infection can develop quickly. It can last for up to 4 weeks (acute) or for more than 12 weeks (chronic). A sinus infection often develops after a cold. What are the causes? This condition is caused by anything that creates swelling in the sinuses or stops mucus from draining. This includes: Allergies. Asthma. Infection from bacteria or viruses. Deformities or blockages in your nose or sinuses. Abnormal growths in the nose (nasal polyps). Pollutants, such as chemicals or irritants in the air. Infection from fungi. This is rare. What increases the risk? You are more likely to develop this condition if you: Have a weak body defense system (immune system). Do a lot of swimming or diving. Overuse nasal sprays. Smoke. What are the signs or symptoms? The main symptoms of this condition are pain and a feeling of pressure around the affected sinuses. Other symptoms include: Stuffy nose or congestion that makes it difficult to breathe through your  nose. Thick yellow or greenish drainage from your nose. Tenderness, swelling, and warmth over the affected sinuses. A cough that may get worse at night. Decreased sense of smell and taste. Extra mucus that collects in the throat or the back of the nose (postnasal drip) causing a sore throat or bad breath. Tiredness (fatigue). Fever. How is this diagnosed? This condition  is diagnosed based on: Your symptoms. Your medical history. A physical exam. Tests to find out if your condition is acute or chronic. This may include: Checking your nose for nasal polyps. Viewing your sinuses using a device that has a light (endoscope). Testing for allergies or bacteria. Imaging tests, such as an MRI or CT scan. In rare cases, a bone biopsy may be done to rule out more serious types of fungal sinus disease. How is this treated? Treatment for a sinus infection depends on the cause and whether your condition is chronic or acute. If caused by a virus, your symptoms should go away on their own within 10 days. You may be given medicines to relieve symptoms. They include: Medicines that shrink swollen nasal passages (decongestants). A spray that eases inflammation of the nostrils (topical intranasal corticosteroids). Rinses that help get rid of thick mucus in your nose (nasal saline washes). Medicines that treat allergies (antihistamines). Over-the-counter pain relievers. If caused by bacteria, your health care provider may recommend waiting to see if your symptoms improve. Most bacterial infections will get better without antibiotic medicine. You may be given antibiotics if you have: A severe infection. A weak immune system. If caused by narrow nasal passages or nasal polyps, surgery may be needed. Follow these instructions at home: Medicines Take, use, or apply over-the-counter and prescription medicines only as told by your health care provider. These may include nasal sprays. If you were prescribed an antibiotic medicine, take it as told by your health care provider. Do not stop taking the antibiotic even if you start to feel better. Hydrate and humidify  Drink enough fluid to keep your urine pale yellow. Staying hydrated will help to thin your mucus. Use a cool mist humidifier to keep the humidity level in your home above 50%. Inhale steam for 10-15 minutes, 3-4 times a  day, or as told by your health care provider. You can do this in the bathroom while a hot shower is running. Limit your exposure to cool or dry air. Rest Rest as much as possible. Sleep with your head raised (elevated). Make sure you get enough sleep each night. General instructions  Apply a warm, moist washcloth to your face 3-4 times a day or as told by your health care provider. This will help with discomfort. Use nasal saline washes as often as told by your health care provider. Wash your hands often with soap and water to reduce your exposure to germs. If soap and water are not available, use hand sanitizer. Do not smoke. Avoid being around people who are smoking (secondhand smoke). Keep all follow-up visits. This is important. Contact a health care provider if: You have a fever. Your symptoms get worse. Your symptoms do not improve within 10 days. Get help right away if: You have a severe headache. You have persistent vomiting. You have severe pain or swelling around your face or eyes. You have vision problems. You develop confusion. Your neck is stiff. You have trouble breathing. These symptoms may be an emergency. Get help right away. Call 911. Do not wait to see if the symptoms will go away.  Do not drive yourself to the hospital. Summary A sinus infection is soreness and inflammation of your sinuses. Sinuses are hollow spaces in the bones around your face. This condition is caused by nasal tissues that become inflamed or swollen. The swelling traps or blocks the flow of mucus. This allows bacteria, viruses, and fungi to grow, which leads to infection. If you were prescribed an antibiotic medicine, take it as told by your health care provider. Do not stop taking the antibiotic even if you start to feel better. Keep all follow-up visits. This is important. This information is not intended to replace advice given to you by your health care provider. Make sure you discuss any  questions you have with your health care provider. Document Revised: 03/07/2021 Document Reviewed: 03/07/2021 Elsevier Patient Education  [place].    If you have been instructed to have an in-person evaluation today at a local Urgent Care facility, please use the link below. It will take you to a list of all of our available [facility], including address, phone number and hours of operation. Please do not delay care.  [facility]  If you or a family member do not have a primary care provider, use the link below to schedule a visit and establish care. When you choose a [facility] primary care physician or advanced practice provider, you gain a long-term partner in health. Find a Primary Care Provider  Learn more about [facility]'s in-office and virtual care options: [facility]

## 2022-03-07 NOTE — Progress Notes (Signed)
Virtual Visit Consent   Sharon Meyer, you are scheduled for a virtual visit with a [facility] provider today. Just as with appointments in the office, your consent must be obtained to participate. Your consent will be active for this visit and any virtual visit you may have with one of our providers in the next 365 days. If you have a MyChart account, a copy of this consent can be sent to you electronically.  As this is a virtual visit, video technology does not allow for your provider to perform a traditional examination. This may limit your provider's ability to fully assess your condition. If your provider identifies any concerns that need to be evaluated in person or the need to arrange testing (such as labs, EKG, etc.), we will make arrangements to do so. Although advances in technology are sophisticated, we cannot ensure that it will always work on either your end or our end. If the connection with a video visit is poor, the visit may have to be switched to a telephone visit. With either a video or telephone visit, we are not always able to ensure that we have a secure connection.  By engaging in this virtual visit, you consent to the provision of healthcare and authorize for your insurance to be billed (if applicable) for the services provided during this visit. Depending on your insurance coverage, you may receive a charge related to this service.  I need to obtain your verbal consent now. Are you willing to proceed with your visit today? Sharon Meyer has provided verbal consent on 03/07/2022 for a virtual visit (video or telephone). Mar Daring, PA-C  Date: 03/07/2022 1:04 PM  Virtual Visit via Video Note   I, Mar Daring, connected with  Sharon Meyer  ([id number], 05-08-1970) on 03/07/22 at  1:00 PM EST by a video-enabled telemedicine application and verified that I am speaking with the correct person using two  identifiers.  Location: Patient: Virtual Visit Location Patient: Mobile Provider: Virtual Visit Location Provider: Home Office   I discussed the limitations of evaluation and management by telemedicine and the availability of in person appointments. The patient expressed understanding and agreed to proceed.    History of Present Illness: Sharon Meyer is a 51 y.o. who identifies as a female who was assigned female at birth, and is being seen today for possible UTI.  HPI: Urinary Tract Infection  This is a new problem. The current episode started yesterday. The problem occurs every urination. The problem has been gradually worsening. The quality of the pain is described as aching and burning. The pain is mild. There has been no fever. Associated symptoms include frequency, hematuria (very small amount), hesitancy and urgency. Pertinent negatives include no chills, discharge, flank pain, nausea or vomiting. She has tried increased fluids for the symptoms. The treatment provided no relief. There is no history of recurrent UTIs.      Problems:  Patient Active Problem List   Diagnosis Date Noted   History of hysterectomy 03/24/2020   Menorrhagia 03/23/2020   Menorrhagia with regular cycle 11/19/2019   Anemia 11/17/2019   Iron deficiency anemia 07/11/2018   Symptomatic anemia 07/10/2018   Abnormality of breast on screening mammography 01/30/2018   Papilloma of breast right  01/30/2018   Mass of right breast 04/16/2013    Allergies: No Known Allergies Medications:  Current Outpatient Medications:    cephALEXin (KEFLEX) 500 MG capsule, Take 1 capsule (500 mg total) by mouth 2 (  two) times daily., Disp: 14 capsule, Rfl: 0   aspirin-acetaminophen-caffeine (EXCEDRIN MIGRAINE) 250-250-65 MG tablet, Take 2 tablets by mouth every 6 (six) hours as needed for headache., Disp: 30 tablet, Rfl: 0   cetirizine (ZYRTEC ALLERGY) 10 MG tablet, Take 1 tablet (10 mg total) by mouth at bedtime.,  Disp: 30 tablet, Rfl: 2   fluticasone (FLONASE) 50 MCG/ACT nasal spray, Place 1 spray into both nostrils daily. Begin by using 2 sprays in each nare daily for 3 to 5 days, then decrease to 1 spray in each nare daily., Disp: 15.8 mL, Rfl: 2  Observations/Objective: Patient is well-developed, well-nourished in no acute distress.  Resting comfortably  Head is normocephalic, atraumatic.  No labored breathing.  Speech is clear and coherent with logical content.  Patient is alert and oriented at baseline.    Assessment and Plan: 1. Suspected UTI - cephALEXin (KEFLEX) 500 MG capsule; Take 1 capsule (500 mg total) by mouth 2 (two) times daily.  Dispense: 14 capsule; Refill: 0  - Worsening symptoms.  - Will treat empirically with Keflex - May use AZO for bladder spasms - Continue to push fluids.  - Seek in person evaluation for urine culture if symptoms do not improve or if they worsen.    Follow Up Instructions: I discussed the assessment and treatment plan with the patient. The patient was provided an opportunity to ask questions and all were answered. The patient agreed with the plan and demonstrated an understanding of the instructions.  A copy of instructions were sent to the patient via MyChart unless otherwise noted below.    The patient was advised to call back or seek an in-person evaluation if the symptoms worsen or if the condition fails to improve as anticipated.  Time:  I spent 8 minutes with the patient via telehealth technology discussing the above problems/concerns.    Mar Daring, PA-C

## 2022-03-27 ENCOUNTER — Encounter: Payer: Self-pay | Admitting: Family Medicine

## 2022-03-27 ENCOUNTER — Ambulatory Visit (INDEPENDENT_AMBULATORY_CARE_PROVIDER_SITE_OTHER): Payer: BC Managed Care – PPO | Admitting: Family Medicine

## 2022-03-27 VITALS — BP 117/84 | HR 70 | Temp 98.1°F | Resp 16 | Wt 206.2 lb

## 2022-03-27 DIAGNOSIS — D509 Iron deficiency anemia, unspecified: Secondary | ICD-10-CM

## 2022-03-27 DIAGNOSIS — N390 Urinary tract infection, site not specified: Secondary | ICD-10-CM | POA: Diagnosis not present

## 2022-03-29 ENCOUNTER — Encounter: Payer: Self-pay | Admitting: Family Medicine

## 2022-03-29 NOTE — Progress Notes (Signed)
Estblished Patient Office Visit  Subjective    Patient ID: Sharon Meyer, female    DOB: [birth year]  Age: 51 y.o. MRN: [id number]  CC:  Chief Complaint  Patient presents with   Follow-up    HPI Sharon Meyer presents for follow up of chronic med issues. She reports that she has had a recent hysterectomy 2/2 anemia. She also reports some recurrent UTIs but denies sx at this time   Outpatient Encounter Medications as of 03/27/2022  Medication Sig   aspirin-acetaminophen-caffeine (EXCEDRIN MIGRAINE) 250-250-65 MG tablet Take 2 tablets by mouth every 6 (six) hours as needed for headache. (Patient not taking: Reported on 03/27/2022)   cephALEXin (KEFLEX) 500 MG capsule Take 1 capsule (500 mg total) by mouth 2 (two) times daily. (Patient not taking: Reported on 03/27/2022)   cetirizine (ZYRTEC ALLERGY) 10 MG tablet Take 1 tablet (10 mg total) by mouth at bedtime.   fluticasone (FLONASE) 50 MCG/ACT nasal spray Place 1 spray into both nostrils daily. Begin by using 2 sprays in each nare daily for 3 to 5 days, then decrease to 1 spray in each nare daily. (Patient not taking: Reported on 03/27/2022)   No facility-administered encounter medications on file as of 03/27/2022.    Past Medical History:  Diagnosis Date   IDA (iron deficiency anemia)    Intraductal papilloma of right breast    s/p right breast lumpectomy 08/09/14, 05/21/18   PONV (postoperative nausea and vomiting)     Past Surgical History:  Procedure Laterality Date   ABDOMINAL HYSTERECTOMY N/A 03/24/2020   Procedure: ABDOMINAL HYSTERECTOMY WITH BILATERAL SALPINGECTOMY;  Surgeon: Sanjuana Kava, MD;  Location: [facility];  Service: Gynecology;  Laterality: N/A;   BREAST EXCISIONAL BIOPSY Right 08/09/2014   BREAST LUMPECTOMY Right 05/21/2018   Procedure: RIGHT BREAST LUMPECTOMY;  Surgeon: Excell Seltzer, MD;  Location: [facility];  Service: General;  Laterality: Right;   BREAST LUMPECTOMY WITH  RADIOACTIVE SEED LOCALIZATION Right 08/09/2014   Procedure: RIGHT BREAST LUMPECTOMY WITH RADIOACTIVE SEED LOCALIZATION;  Surgeon: Excell Seltzer, MD;  Location: [facility];  Service: General;  Laterality: Right;   BREAST SURGERY N/A    Phreesia 11/10/2019   btl     CYSTOSCOPY  03/24/2020   Procedure: CYSTOSCOPY;  Surgeon: Sanjuana Kava, MD;  Location: [facility];  Service: Gynecology;;   HERNIA REPAIR     umbilical hernia    Family History  Problem Relation Age of Onset   Healthy Mother    Healthy Father     Social History   Socioeconomic History   Marital status: Single    Spouse name: Not on file   Number of children: Not on file   Years of education: Not on file   Highest education level: Not on file  Occupational History   Not on file  Tobacco Use   Smoking status: Never   Smokeless tobacco: Never  Vaping Use   Vaping Use: Never used  Substance and Sexual Activity   Alcohol use: No   Drug use: No   Sexual activity: Not Currently    Birth control/protection: Surgical  Other Topics Concern   Not on file  Social History Narrative   Not on file   Social Determinants of Health   Financial Resource Strain: Not on file  Food Insecurity: Not on file  Transportation Needs: Not on file  Physical Activity: Not on file  Stress: Not on file  Social Connections: Not on file  Intimate Partner  Violence: Not on file    Review of Systems  All other systems reviewed and are negative.       Objective    BP 117/84   Pulse 70   Temp 98.1 F (36.7 C) (Oral)   Resp 16   Wt 206 lb 3.2 oz (93.5 kg)   LMP 11/13/2019 (Approximate)   SpO2 95%   BMI 32.30 kg/m   Physical Exam Vitals and nursing note reviewed.  Constitutional:      General: She is not in acute distress. Cardiovascular:     Rate and Rhythm: Normal rate and regular rhythm.  Pulmonary:     Effort: Pulmonary effort is normal.     Breath sounds: Normal breath sounds.  Abdominal:      Palpations: Abdomen is soft.     Tenderness: There is no abdominal tenderness.  Neurological:     General: No focal deficit present.     Mental Status: She is alert and oriented to person, place, and time.         Assessment & Plan:   1. Iron deficiency anemia, unspecified iron deficiency anemia type Monitroing labs ordered. Patient to take OTC vitamin/iron supplements at this time - CBC with Differential  2. Recurrent UTI Patient defers further eval/mgt at this time    Return in about 6 months (around 09/26/2022) for physical.   Becky Sax, MD

## 2022-05-14 DIAGNOSIS — L02411 Cutaneous abscess of right axilla: Secondary | ICD-10-CM | POA: Diagnosis not present

## 2022-10-02 ENCOUNTER — Encounter: Payer: Self-pay | Admitting: Family Medicine

## 2022-10-02 ENCOUNTER — Ambulatory Visit (INDEPENDENT_AMBULATORY_CARE_PROVIDER_SITE_OTHER): Payer: BC Managed Care – PPO | Admitting: Family Medicine

## 2022-10-02 VITALS — BP 119/81 | HR 79 | Temp 97.7°F | Resp 16 | Ht 68.0 in | Wt 200.8 lb

## 2022-10-02 DIAGNOSIS — K5730 Diverticulosis of large intestine without perforation or abscess without bleeding: Secondary | ICD-10-CM | POA: Insufficient documentation

## 2022-10-02 DIAGNOSIS — E669 Obesity, unspecified: Secondary | ICD-10-CM | POA: Insufficient documentation

## 2022-10-02 DIAGNOSIS — Z130 Encounter for screening for diseases of the blood and blood-forming organs and certain disorders involving the immune mechanism: Secondary | ICD-10-CM | POA: Diagnosis not present

## 2022-10-02 DIAGNOSIS — Z1211 Encounter for screening for malignant neoplasm of colon: Secondary | ICD-10-CM | POA: Insufficient documentation

## 2022-10-02 DIAGNOSIS — Z0000 Encounter for general adult medical examination without abnormal findings: Secondary | ICD-10-CM

## 2022-10-02 DIAGNOSIS — Z13220 Encounter for screening for lipoid disorders: Secondary | ICD-10-CM

## 2022-10-02 DIAGNOSIS — Z1329 Encounter for screening for other suspected endocrine disorder: Secondary | ICD-10-CM | POA: Diagnosis not present

## 2022-10-02 NOTE — Progress Notes (Unsigned)
-  Patient is here to have annually  complete physical examination  -Care gap address -labs taken  

## 2022-10-03 ENCOUNTER — Encounter: Payer: Self-pay | Admitting: Family Medicine

## 2022-10-03 LAB — CMP14+EGFR
ALT: 22 IU/L (ref 0–32)
AST: 42 IU/L — ABNORMAL HIGH (ref 0–40)
Albumin: 4.2 g/dL (ref 3.8–4.9)
Alkaline Phosphatase: 79 IU/L (ref 44–121)
BUN/Creatinine Ratio: 16 (ref 9–23)
BUN: 10 mg/dL (ref 6–24)
Bilirubin Total: <0.2 mg/dL (ref 0.0–1.2)
CO2: 20 mmol/L (ref 20–29)
Calcium: 9.3 mg/dL (ref 8.7–10.2)
Chloride: 105 mmol/L (ref 96–106)
Creatinine, Ser: 0.64 mg/dL (ref 0.57–1.00)
Globulin, Total: 3.6 g/dL (ref 1.5–4.5)
Glucose: 63 mg/dL — ABNORMAL LOW (ref 70–99)
Potassium: 4.3 mmol/L (ref 3.5–5.2)
Sodium: 142 mmol/L (ref 134–144)
Total Protein: 7.8 g/dL (ref 6.0–8.5)
eGFR: 106 mL/min/{1.73_m2} (ref >59)

## 2022-10-03 LAB — HEMOGLOBIN A1C
Est. average glucose Bld gHb Est-mCnc: 117 mg/dL
Hgb A1c MFr Bld: 5.7 % — ABNORMAL HIGH (ref 4.8–5.6)

## 2022-10-03 LAB — CBC WITH DIFFERENTIAL/PLATELET
Basophils Absolute: 0 10*3/uL (ref 0.0–0.2)
Basos: 1 %
EOS (ABSOLUTE): 0.2 10*3/uL (ref 0.0–0.4)
Eos: 3 %
Hematocrit: 41.5 % (ref 34.0–46.6)
Hemoglobin: 13.4 g/dL (ref 11.1–15.9)
Immature Grans (Abs): 0 10*3/uL (ref 0.0–0.1)
Immature Granulocytes: 0 %
Lymphocytes Absolute: 1.8 10*3/uL (ref 0.7–3.1)
Lymphs: 30 %
MCH: 29.5 pg (ref 26.6–33.0)
MCHC: 32.3 g/dL (ref 31.5–35.7)
MCV: 91 fL (ref 79–97)
Monocytes Absolute: 0.5 10*3/uL (ref 0.1–0.9)
Monocytes: 8 %
Neutrophils Absolute: 3.5 10*3/uL (ref 1.4–7.0)
Neutrophils: 58 %
Platelets: 261 10*3/uL (ref 150–450)
RBC: 4.55 x10E6/uL (ref 3.77–5.28)
RDW: 13.1 % (ref 11.7–15.4)
WBC: 5.9 10*3/uL (ref 3.4–10.8)

## 2022-10-03 LAB — VITAMIN D 25 HYDROXY (VIT D DEFICIENCY, FRACTURES): Vit D, 25-Hydroxy: 34.5 ng/mL (ref 30.0–100.0)

## 2022-10-03 LAB — LIPID PANEL
Chol/HDL Ratio: 3.1 ratio (ref 0.0–4.4)
Cholesterol, Total: 187 mg/dL (ref 100–199)
HDL: 60 mg/dL (ref >39)
LDL Chol Calc (NIH): 117 mg/dL — ABNORMAL HIGH (ref 0–99)
Triglycerides: 55 mg/dL (ref 0–149)
VLDL Cholesterol Cal: 10 mg/dL (ref 5–40)

## 2022-10-03 LAB — TSH: TSH: 0.872 u[IU]/mL (ref 0.450–4.500)

## 2022-10-03 NOTE — Progress Notes (Signed)
Established Patient Office Visit  Subjective    Patient ID: Sharon Meyer, female    DOB: 05/18/1970  Age: 52 y.o. MRN: [id number]  CC: No chief complaint on file.   HPI Sharon Meyer presents for routine annual exam. Patient denies acute complaints or concerns.   Outpatient Encounter Medications as of 10/02/2022  Medication Sig   fluticasone (FLONASE) 50 MCG/ACT nasal spray Place 1 spray into both nostrils daily. Begin by using 2 sprays in each nare daily for 3 to 5 days, then decrease to 1 spray in each nare daily. (Patient not taking: Reported on 03/27/2022)   [DISCONTINUED] aspirin-acetaminophen-caffeine (EXCEDRIN MIGRAINE) 250-250-65 MG tablet Take 2 tablets by mouth every 6 (six) hours as needed for headache. (Patient not taking: Reported on 03/27/2022)   [DISCONTINUED] cephALEXin (KEFLEX) 500 MG capsule Take 1 capsule (500 mg total) by mouth 2 (two) times daily. (Patient not taking: Reported on 03/27/2022)   [DISCONTINUED] cetirizine (ZYRTEC ALLERGY) 10 MG tablet Take 1 tablet (10 mg total) by mouth at bedtime.   No facility-administered encounter medications on file as of 10/02/2022.    Past Medical History:  Diagnosis Date   IDA (iron deficiency anemia)    Intraductal papilloma of right breast    s/p right breast lumpectomy 08/09/14, 05/21/18   PONV (postoperative nausea and vomiting)     Past Surgical History:  Procedure Laterality Date   ABDOMINAL HYSTERECTOMY N/A 03/24/2020   Procedure: ABDOMINAL HYSTERECTOMY WITH BILATERAL SALPINGECTOMY;  Surgeon: Essie Hart, MD;  Location[facility];  Service: Gynecology;  Laterality: N/A;   BREAST EXCISIONAL BIOPSY Right 08/09/2014   BREAST LUMPECTOMY Right 05/21/2018   Procedure: RIGHT BREAST LUMPECTOMY;  Surgeon: Glenna Fellows, MD;  Location: [facility];  Service: General;  Laterality: Right;   BREAST LUMPECTOMY WITH RADIOACTIVE SEED LOCALIZATION Right 08/09/2014   Procedure: RIGHT BREAST  LUMPECTOMY WITH RADIOACTIVE SEED LOCALIZATION;  Surgeon: Glenna Fellows, MD;  Location: [facility];  Service: General;  Laterality: Right;   BREAST SURGERY N/A    Phreesia 11/10/2019   btl     CYSTOSCOPY  03/24/2020   Procedure: CYSTOSCOPY;  Surgeon: Essie Hart, MD;  Location: [facility];  Service: Gynecology;;   HERNIA REPAIR     umbilical hernia    Family History  Problem Relation Age of Onset   Healthy Mother    Healthy Father     Social History   Socioeconomic History   Marital status: Single    Spouse name: Not on file   Number of children: Not on file   Years of education: Not on file   Highest education level: Not on file  Occupational History   Not on file  Tobacco Use   Smoking status: Never   Smokeless tobacco: Never  Vaping Use   Vaping Use: Never used  Substance and Sexual Activity   Alcohol use: No   Drug use: No   Sexual activity: Not Currently    Birth control/protection: Surgical  Other Topics Concern   Not on file  Social History Narrative   Not on file   Social Determinants of Health   Financial Resource Strain: Not on file  Food Insecurity: Not on file  Transportation Needs: Not on file  Physical Activity: Not on file  Stress: Not on file  Social Connections: Not on file  Intimate Partner Violence: Not on file    Review of Systems  All other systems reviewed and are negative.  Objective    BP 119/81   Pulse 79   Temp 97.7 F (36.5 C) (Oral)   Resp 16   Ht 5\' 8"  (1.727 m)   Wt 200 lb 12.8 oz (91.1 kg)   LMP 11/13/2019 (Approximate)   SpO2 98%   BMI 30.53 kg/m   Physical Exam Vitals and nursing note reviewed.  Constitutional:      General: She is not in acute distress. HENT:     Head: Normocephalic and atraumatic.     Right Ear: Tympanic membrane, ear canal and external ear normal.     Left Ear: Tympanic membrane, ear canal and external ear normal.     Nose: Nose normal.     Mouth/Throat:     Mouth:  Mucous membranes are moist.     Pharynx: Oropharynx is clear.  Eyes:     Conjunctiva/sclera: Conjunctivae normal.     Pupils: Pupils are equal, round, and reactive to light.  Neck:     Thyroid: No thyromegaly.  Cardiovascular:     Rate and Rhythm: Normal rate and regular rhythm.     Heart sounds: Normal heart sounds. No murmur heard. Pulmonary:     Effort: Pulmonary effort is normal. No respiratory distress.     Breath sounds: Normal breath sounds.  Abdominal:     General: There is no distension.     Palpations: Abdomen is soft. There is no mass.     Tenderness: There is no abdominal tenderness.  Musculoskeletal:        General: Normal range of motion.     Cervical back: Normal range of motion and neck supple.  Skin:    General: Skin is warm and dry.  Neurological:     General: No focal deficit present.     Mental Status: She is alert and oriented to person, place, and time.  Psychiatric:        Mood and Affect: Mood normal.        Behavior: Behavior normal.         Assessment & Plan:   1. Annual physical exam  - CMP14+EGFR  2. Screening for deficiency anemia  - CBC with Differential  3. Screening for lipid disorders  - Lipid Panel  4. Screening for endocrine/metabolic/immunity disorders  - Vitamin D, 25-hydroxy - TSH - Hemoglobin A1c    No follow-ups on file.   Tommie Raymond, MD

## 2023-01-18 DIAGNOSIS — Z1231 Encounter for screening mammogram for malignant neoplasm of breast: Secondary | ICD-10-CM | POA: Diagnosis not present

## 2023-01-18 DIAGNOSIS — Z01419 Encounter for gynecological examination (general) (routine) without abnormal findings: Secondary | ICD-10-CM | POA: Diagnosis not present

## 2023-02-01 ENCOUNTER — Ambulatory Visit: Payer: BC Managed Care – PPO | Admitting: Family Medicine

## 2023-05-25 ENCOUNTER — Ambulatory Visit: Payer: BC Managed Care – PPO

## 2023-06-14 ENCOUNTER — Ambulatory Visit (INDEPENDENT_AMBULATORY_CARE_PROVIDER_SITE_OTHER): Payer: BC Managed Care – PPO | Admitting: Family Medicine

## 2023-06-14 ENCOUNTER — Encounter: Payer: Self-pay | Admitting: Family Medicine

## 2023-06-14 VITALS — BP 125/83 | HR 68 | Temp 98.1°F | Resp 16 | Ht 68.0 in | Wt 205.8 lb

## 2023-06-14 DIAGNOSIS — D509 Iron deficiency anemia, unspecified: Secondary | ICD-10-CM | POA: Diagnosis not present

## 2023-06-14 DIAGNOSIS — F4321 Adjustment disorder with depressed mood: Secondary | ICD-10-CM | POA: Diagnosis not present

## 2023-06-14 MED ORDER — SERTRALINE HCL 50 MG PO TABS: 50.0000 mg | ORAL_TABLET | Freq: Every day | ORAL | 1 refills | Status: AC

## 2023-06-14 NOTE — Progress Notes (Signed)
 Patient is here for their 6 month follow-up Patient has no concerns today Care gaps have been discussed with patient

## 2023-06-14 NOTE — Progress Notes (Signed)
 Established Patient Office Visit  Subjective    Patient ID: Sharon Meyer, female    DOB: [birth year]  Age: 53 y.o. MRN: [id number]  CC:  Chief Complaint  Patient presents with   Medical Management of Chronic Issues    HPI Sharon Meyer presents with complaint of increased social stressors and some sx of depression.   Outpatient Encounter Medications as of 06/14/2023  Medication Sig   sertraline (ZOLOFT) 50 MG tablet Take 1 tablet (50 mg total) by mouth daily.   [DISCONTINUED] fluticasone (FLONASE) 50 MCG/ACT nasal spray Place 1 spray into both nostrils daily. Begin by using 2 sprays in each nare daily for 3 to 5 days, then decrease to 1 spray in each nare daily. (Patient not taking: Reported on 03/27/2022)   No facility-administered encounter medications on file as of 06/14/2023.    Past Medical History:  Diagnosis Date   IDA (iron deficiency anemia)    Intraductal papilloma of right breast    s/p right breast lumpectomy 08/09/14, 05/21/18   PONV (postoperative nausea and vomiting)     Past Surgical History:  Procedure Laterality Date   ABDOMINAL HYSTERECTOMY N/A 03/24/2020   Procedure: ABDOMINAL HYSTERECTOMY WITH BILATERAL SALPINGECTOMY;  Surgeon: Essie Hart, MD;  Location: [facility];  Service: Gynecology;  Laterality: N/A;   BREAST EXCISIONAL BIOPSY Right 08/09/2014   BREAST LUMPECTOMY Right 05/21/2018   Procedure: RIGHT BREAST LUMPECTOMY;  Surgeon: Glenna Fellows, MD;  Location: [facility];  Service: General;  Laterality: Right;   BREAST LUMPECTOMY WITH RADIOACTIVE SEED LOCALIZATION Right 08/09/2014   Procedure: RIGHT BREAST LUMPECTOMY WITH RADIOACTIVE SEED LOCALIZATION;  Surgeon: Glenna Fellows, MD;  Location:  SURGERY CENTER;  Service: General;  Laterality: Right;   BREAST SURGERY N/A    Phreesia 11/10/2019   btl     CYSTOSCOPY  03/24/2020   Procedure: CYSTOSCOPY;  Surgeon: Essie Hart, MD;  Location: [facility];  Service:  Gynecology;;   HERNIA REPAIR     umbilical hernia    Family History  Problem Relation Age of Onset   Healthy Mother    Healthy Father     Social History   Socioeconomic History   Marital status: Single    Spouse name: Not on file   Number of children: Not on file   Years of education: Not on file   Highest education level: Associate degree: occupational, Scientist, product/process development, or vocational program  Occupational History   Not on file  Tobacco Use   Smoking status: Never   Smokeless tobacco: Never  Vaping Use   Vaping status: Never Used  Substance and Sexual Activity   Alcohol use: No   Drug use: No   Sexual activity: Not Currently    Birth control/protection: Surgical  Other Topics Concern   Not on file  Social History Narrative   Not on file   Social Drivers of Health   Financial Resource Strain: Low Risk  (06/10/2023)   Overall Financial Resource Strain (CARDIA)    Difficulty of Paying Living Expenses: Not hard at all  Food Insecurity: No Food Insecurity (06/10/2023)   Hunger Vital Sign    Worried About Running Out of Food in the Last Year: Never true    Ran Out of Food in the Last Year: Never true  Transportation Needs: No Transportation Needs (06/10/2023)   PRAPARE - Administrator, Civil Service (Medical): No    Lack of Transportation (Non-Medical): No  Physical Activity: Sufficiently Active (06/10/2023)  Exercise Vital Sign    Days of Exercise per Week: 4 days    Minutes of Exercise per Session: 40 min  Stress: Stress Concern Present (06/10/2023)   [employer] of Occupational Health - Occupational Stress Questionnaire    Feeling of Stress : Very much  Social Connections: Unknown (06/10/2023)   Social Connection and Isolation Panel [NHANES]    Frequency of Communication with Friends and Family: More than three times a week    Frequency of Social Gatherings with Friends and Family: More than three times a week    Attends Religious Services: More than 4  times per year    Active Member of [employer] or Organizations: No    Attends Engineer, structural: Not on file    Marital Status: Patient declined  Intimate Partner Violence: Not on file    Review of Systems  All other systems reviewed and are negative.       Objective    BP 125/83   Pulse 68   Temp 98.1 F (36.7 C) (Oral)   Resp 16   Ht 5\' 8"  (1.727 m)   Wt 205 lb 12.8 oz (93.4 kg)   LMP 11/13/2019 (Approximate)   BMI 31.29 kg/m   Physical Exam Vitals and nursing note reviewed.  Constitutional:      General: She is not in acute distress. Cardiovascular:     Rate and Rhythm: Normal rate and regular rhythm.  Pulmonary:     Effort: Pulmonary effort is normal.     Breath sounds: Normal breath sounds.  Abdominal:     Palpations: Abdomen is soft.     Tenderness: There is no abdominal tenderness.  Neurological:     General: No focal deficit present.     Mental Status: She is alert and oriented to person, place, and time.         Assessment & Plan:   Situational depression  Iron deficiency anemia, unspecified iron deficiency anemia type  Other orders -     Sertraline HCl; Take 1 tablet (50 mg total) by mouth daily.  Dispense: 30 tablet; Refill: 1     No follow-ups on file.   Tommie Raymond, MD

## 2023-06-17 ENCOUNTER — Encounter: Payer: Self-pay | Admitting: Family Medicine

## 2023-07-02 ENCOUNTER — Ambulatory Visit

## 2023-07-19 ENCOUNTER — Ambulatory Visit: Payer: BC Managed Care – PPO | Admitting: Family Medicine

## 2023-08-29 ENCOUNTER — Ambulatory Visit
Admission: RE | Admit: 2023-08-29 | Discharge: 2023-08-29 | Disposition: A | Discharge status: Home / Self Care | Source: Ambulatory Visit | Attending: Family Medicine | Admitting: Family Medicine

## 2023-08-29 VITALS — BP 139/89 | HR 57 | Temp 98.2°F | Resp 16

## 2023-08-29 DIAGNOSIS — R42 Dizziness and giddiness: Secondary | ICD-10-CM | POA: Diagnosis not present

## 2023-08-29 MED ORDER — FLUTICASONE PROPIONATE 50 MCG/ACT NA SUSP: 2.0000 | Freq: Every day | NASAL | 0 refills | Status: AC

## 2023-08-29 MED ORDER — MECLIZINE HCL 12.5 MG PO TABS
12.5000 mg | ORAL_TABLET | Freq: Three times a day (TID) | ORAL | 0 refills | Status: AC | PRN
Start: 2023-08-29 — End: ?

## 2023-08-29 MED ORDER — CETIRIZINE HCL 10 MG PO TABS: 10.0000 mg | ORAL_TABLET | Freq: Every day | ORAL | 0 refills | Status: AC

## 2023-08-29 NOTE — ED Provider Notes (Signed)
 [facility]  Note:  This document was prepared using Conservation officer, historic buildings and may include unintentional dictation errors.  MRN: [id number] DOB: 09/13/1970  Subjective:   Sharon Meyer is a 53 y.o. female presenting for 3-week history of acute onset persistent dizziness, vertigo with intermittent headaches.  Patient also has allergies.  Has used ibuprofen  with relief of her intermittent headaches.  No confusion, double vision, throat pain, cough, chest pain, ear pain, tinnitus, shortness of breath, wheezing, nausea, vomiting, abdominal pain.  No smoking.  No asthma.  Does not take anything for her allergies.  Patient also reports that she is exposed to very loud sound and noise at her job.  Tries to wear earplugs.  No current facility-administered medications for this encounter.  Current Outpatient Medications:    sertraline  (ZOLOFT ) 50 MG tablet, Take 1 tablet (50 mg total) by mouth daily., Disp: 30 tablet, Rfl: 1   No Known Allergies  Past Medical History:  Diagnosis Date   IDA (iron  deficiency anemia)    Intraductal papilloma of right breast    s/p right breast lumpectomy 08/09/14, 05/21/18   PONV (postoperative nausea and vomiting)      Past Surgical History:  Procedure Laterality Date   ABDOMINAL HYSTERECTOMY N/A 03/24/2020   Procedure: ABDOMINAL HYSTERECTOMY WITH BILATERAL SALPINGECTOMY;  Surgeon: Artemisa Bile, MD;  Location: [facility];  Service: Gynecology;  Laterality: N/A;   BREAST EXCISIONAL BIOPSY Right 08/09/2014   BREAST LUMPECTOMY Right 05/21/2018   Procedure: RIGHT BREAST LUMPECTOMY;  Surgeon: Ayesha Lente, MD;  Location: [facility];  Service: General;  Laterality: Right;   BREAST LUMPECTOMY WITH RADIOACTIVE SEED LOCALIZATION Right 08/09/2014   Procedure: RIGHT BREAST LUMPECTOMY WITH RADIOACTIVE SEED LOCALIZATION;  Surgeon: Ayesha Lente, MD;  Location: [facility];  Service: General;   Laterality: Right;   BREAST SURGERY N/A    Phreesia 11/10/2019   btl     CYSTOSCOPY  03/24/2020   Procedure: CYSTOSCOPY;  Surgeon: Artemisa Bile, MD;  Location: [facility];  Service: Gynecology;;   HERNIA REPAIR     umbilical hernia    Family History  Problem Relation Age of Onset   Healthy Mother    Healthy Father     Social History   Tobacco Use   Smoking status: Never   Smokeless tobacco: Never  Vaping Use   Vaping status: Never Used  Substance Use Topics   Alcohol use: No   Drug use: No    ROS   Objective:   Vitals: BP 139/89 (BP Location: Left Arm)   Pulse (!) 57   Temp 98.2 F (36.8 C) (Oral)   Resp 16   LMP 11/13/2019 (Approximate)   SpO2 98%   Physical Exam Constitutional:      General: She is not in acute distress.    Appearance: Normal appearance. She is well-developed and normal weight. She is not ill-appearing, toxic-appearing or diaphoretic.  HENT:     Head: Normocephalic and atraumatic.     Right Ear: Tympanic membrane, ear canal and external ear normal. No drainage or tenderness. No middle ear effusion. There is no impacted cerumen. Tympanic membrane is not erythematous or bulging.     Left Ear: Tympanic membrane, ear canal and external ear normal. No drainage or tenderness.  No middle ear effusion. There is no impacted cerumen. Tympanic membrane is not erythematous or bulging.     Nose: Nose normal. No congestion or rhinorrhea.     Mouth/Throat:  Mouth: Mucous membranes are moist. No oral lesions.     Pharynx: No pharyngeal swelling, oropharyngeal exudate, posterior oropharyngeal erythema or uvula swelling.     Tonsils: No tonsillar exudate or tonsillar abscesses.  Eyes:     General: No scleral icterus.       Right eye: No discharge.        Left eye: No discharge.     Extraocular Movements: Extraocular movements intact.     Right eye: Normal extraocular motion.     Left eye: Normal extraocular motion.     Conjunctiva/sclera: Conjunctivae  normal.     Pupils: Pupils are equal, round, and reactive to light.  Neck:     Meningeal: Brudzinski's sign and Kernig's sign absent.  Cardiovascular:     Rate and Rhythm: Normal rate and regular rhythm.     Heart sounds: Normal heart sounds. No murmur heard.    No friction rub. No gallop.  Pulmonary:     Effort: Pulmonary effort is normal. No respiratory distress.     Breath sounds: No stridor. No wheezing, rhonchi or rales.  Chest:     Chest wall: No tenderness.  Musculoskeletal:     Cervical back: Normal range of motion and neck supple.  Lymphadenopathy:     Cervical: No cervical adenopathy.  Skin:    General: Skin is warm and dry.  Neurological:     General: No focal deficit present.     Mental Status: She is alert and oriented to person, place, and time.     Cranial Nerves: No cranial nerve deficit, dysarthria or facial asymmetry.     Motor: No weakness or pronator drift.     Coordination: Romberg sign negative. Coordination normal. Finger-Nose-Finger Test and Heel to [facility] Test normal. Rapid alternating movements normal.     Gait: Gait and tandem walk normal.     Deep Tendon Reflexes: Reflexes normal.  Psychiatric:        Mood and Affect: Mood normal.        Behavior: Behavior normal.        Thought Content: Thought content normal.        Judgment: Judgment normal.     Assessment and Plan :   PDMP not reviewed this encounter.  1. Vertigo    Patient is hemodynamically stable.  Discussed etiology and management of vertigo.  Has a normal neurologic and cardiopulmonary exam.  Recommend conservative management.  Counseled patient on potential for adverse effects with medications prescribed/recommended today, ER and return-to-clinic precautions discussed, patient verbalized understanding.    Adolph Hoop, [place] 08/29/23 [id number]

## 2023-08-29 NOTE — ED Triage Notes (Signed)
 Pt states dizziness and headache for the past 3 weeks. States she takes an ibuprofen  every morning which helps her headache.

## 2023-09-13 ENCOUNTER — Ambulatory Visit (INDEPENDENT_AMBULATORY_CARE_PROVIDER_SITE_OTHER): Admitting: Family Medicine

## 2023-09-13 ENCOUNTER — Encounter: Payer: Self-pay | Admitting: Family Medicine

## 2023-09-13 VITALS — BP 124/86 | HR 82 | Wt 206.2 lb

## 2023-09-13 DIAGNOSIS — Z13228 Encounter for screening for other metabolic disorders: Secondary | ICD-10-CM

## 2023-09-13 DIAGNOSIS — Z13220 Encounter for screening for lipoid disorders: Secondary | ICD-10-CM

## 2023-09-13 DIAGNOSIS — Z130 Encounter for screening for diseases of the blood and blood-forming organs and certain disorders involving the immune mechanism: Secondary | ICD-10-CM

## 2023-09-13 DIAGNOSIS — Z0000 Encounter for general adult medical examination without abnormal findings: Secondary | ICD-10-CM | POA: Diagnosis not present

## 2023-09-13 DIAGNOSIS — Z1329 Encounter for screening for other suspected endocrine disorder: Secondary | ICD-10-CM

## 2023-09-13 NOTE — Progress Notes (Signed)
 Established Patient Office Visit  Subjective    Patient ID: Sharon Meyer, female    DOB: 07/25/1970  Age: 53 y.o. MRN: [id number]  CC:  Chief Complaint  Patient presents with   Annual Exam   Dizziness    HPI Sharon Meyer presents for routine annual exam. Patient reports tha tshe has had a recent bout of vertigo for which she was prescribed meclizine  and has improved.   Outpatient Encounter Medications as of 09/13/2023  Medication Sig   cetirizine  (ZYRTEC  ALLERGY) 10 MG tablet Take 1 tablet (10 mg total) by mouth daily.   meclizine  (ANTIVERT ) 12.5 MG tablet Take 1 tablet (12.5 mg total) by mouth 3 (three) times daily as needed for dizziness.   fluticasone  (FLONASE ) 50 MCG/ACT nasal spray Place 2 sprays into both nostrils daily. (Patient not taking: Reported on 09/13/2023)   sertraline  (ZOLOFT ) 50 MG tablet Take 1 tablet (50 mg total) by mouth daily. (Patient not taking: Reported on 09/13/2023)   No facility-administered encounter medications on file as of 09/13/2023.    Past Medical History:  Diagnosis Date   IDA (iron  deficiency anemia)    Intraductal papilloma of right breast    s/p right breast lumpectomy 08/09/14, 05/21/18   PONV (postoperative nausea and vomiting)     Past Surgical History:  Procedure Laterality Date   ABDOMINAL HYSTERECTOMY N/A 03/24/2020   Procedure: ABDOMINAL HYSTERECTOMY WITH BILATERAL SALPINGECTOMY;  Surgeon: Artemisa Bile, MD;  Location: [facility];  Service: Gynecology;  Laterality: N/A;   BREAST EXCISIONAL BIOPSY Right 08/09/2014   BREAST LUMPECTOMY Right 05/21/2018   Procedure: RIGHT BREAST LUMPECTOMY;  Surgeon: Ayesha Lente, MD;  Location: [facility];  Service: General;  Laterality: Right;   BREAST LUMPECTOMY WITH RADIOACTIVE SEED LOCALIZATION Right 08/09/2014   Procedure: RIGHT BREAST LUMPECTOMY WITH RADIOACTIVE SEED LOCALIZATION;  Surgeon: Ayesha Lente, MD;  Location: [facility];  Service:  General;  Laterality: Right;   BREAST SURGERY N/A    Phreesia 11/10/2019   btl     CYSTOSCOPY  03/24/2020   Procedure: CYSTOSCOPY;  Surgeon: Artemisa Bile, MD;  Location: [facility];  Service: Gynecology;;   HERNIA REPAIR     umbilical hernia    Family History  Problem Relation Age of Onset   Healthy Mother    Healthy Father     Social History   Socioeconomic History   Marital status: Single    Spouse name: Not on file   Number of children: Not on file   Years of education: Not on file   Highest education level: Associate degree: occupational, Scientist, product/process development, or vocational program  Occupational History   Not on file  Tobacco Use   Smoking status: Never   Smokeless tobacco: Never  Vaping Use   Vaping status: Never Used  Substance and Sexual Activity   Alcohol use: No   Drug use: No   Sexual activity: Not Currently    Birth control/protection: Surgical  Other Topics Concern   Not on file  Social History Narrative   Not on file   Social Drivers of Health   Financial Resource Strain: Low Risk  (06/10/2023)   Overall Financial Resource Strain (CARDIA)    Difficulty of Paying Living Expenses: Not hard at all  Food Insecurity: No Food Insecurity (06/10/2023)   Hunger Vital Sign    Worried About Running Out of Food in the Last Year: Never true    Ran Out of Food in the Last Year: Never true  Transportation Needs: No Transportation Needs (06/10/2023)   PRAPARE - Administrator, Civil Service (Medical): No    Lack of Transportation (Non-Medical): No  Physical Activity: Sufficiently Active (06/10/2023)   Exercise Vital Sign    Days of Exercise per Week: 4 days    Minutes of Exercise per Session: 40 min  Stress: Stress Concern Present (06/10/2023)   [employer] of Occupational Health - Occupational Stress Questionnaire    Feeling of Stress : Very much  Social Connections: Unknown (06/10/2023)   Social Connection and Isolation Panel [NHANES]    Frequency of  Communication with Friends and Family: More than three times a week    Frequency of Social Gatherings with Friends and Family: More than three times a week    Attends Religious Services: More than 4 times per year    Active Member of [employer] or Organizations: No    Attends Engineer, structural: Not on file    Marital Status: Patient declined  Intimate Partner Violence: Not on file    Review of Systems  Neurological:  Positive for dizziness.  All other systems reviewed and are negative.       Objective    BP 124/86 (BP Location: Right Arm, Patient Position: Sitting, Cuff Size: Normal)   Pulse 82   Wt 206 lb 3.2 oz (93.5 kg)   LMP 11/13/2019 (Approximate)   SpO2 99%   BMI 31.35 kg/m   Physical Exam Vitals and nursing note reviewed.  Constitutional:      General: She is not in acute distress. HENT:     Head: Normocephalic and atraumatic.     Right Ear: Tympanic membrane, ear canal and external ear normal.     Left Ear: Tympanic membrane, ear canal and external ear normal.     Nose: Nose normal.     Mouth/Throat:     Mouth: Mucous membranes are moist.     Pharynx: Oropharynx is clear.  Eyes:     Conjunctiva/sclera: Conjunctivae normal.     Pupils: Pupils are equal, round, and reactive to light.  Neck:     Thyroid: No thyromegaly.  Cardiovascular:     Rate and Rhythm: Normal rate and regular rhythm.     Heart sounds: Normal heart sounds. No murmur heard. Pulmonary:     Effort: Pulmonary effort is normal. No respiratory distress.     Breath sounds: Normal breath sounds.  Abdominal:     General: There is no distension.     Palpations: Abdomen is soft. There is no mass.     Tenderness: There is no abdominal tenderness.  Musculoskeletal:        General: Normal range of motion.     Cervical back: Normal range of motion and neck supple.  Skin:    General: Skin is warm and dry.  Neurological:     General: No focal deficit present.     Mental Status: She is  alert and oriented to person, place, and time.  Psychiatric:        Mood and Affect: Mood normal.        Behavior: Behavior normal.         Assessment & Plan:   Annual physical exam -     CMP14+EGFR  Screening for deficiency anemia -     CBC with Differential/Platelet  Screening for lipid disorders -     Lipid panel  Screening for endocrine/metabolic/immunity disorders -     VITAMIN D  25 Hydroxy (Vit-D  Deficiency, Fractures) -     Hemoglobin A1c -     TSH     No follow-ups on file.   Arlo Lama, MD

## 2023-09-14 LAB — CBC WITH DIFFERENTIAL/PLATELET
Basophils Absolute: 0 10*3/uL (ref 0.0–0.2)
Basos: 1 %
EOS (ABSOLUTE): 0.2 10*3/uL (ref 0.0–0.4)
Eos: 5 %
Hematocrit: 41.3 % (ref 34.0–46.6)
Hemoglobin: 13.5 g/dL (ref 11.1–15.9)
Immature Grans (Abs): 0 10*3/uL (ref 0.0–0.1)
Immature Granulocytes: 0 %
Lymphocytes Absolute: 1.6 10*3/uL (ref 0.7–3.1)
Lymphs: 36 %
MCH: 29.9 pg (ref 26.6–33.0)
MCHC: 32.7 g/dL (ref 31.5–35.7)
MCV: 92 fL (ref 79–97)
Monocytes Absolute: 0.4 10*3/uL (ref 0.1–0.9)
Monocytes: 9 %
Neutrophils Absolute: 2.2 10*3/uL (ref 1.4–7.0)
Neutrophils: 49 %
Platelets: 248 10*3/uL (ref 150–450)
RBC: 4.51 x10E6/uL (ref 3.77–5.28)
RDW: 12.9 % (ref 11.7–15.4)
WBC: 4.5 10*3/uL (ref 3.4–10.8)

## 2023-09-14 LAB — CMP14+EGFR
ALT: 23 IU/L (ref 0–32)
AST: 22 IU/L (ref 0–40)
Albumin: 4.2 g/dL (ref 3.8–4.9)
Alkaline Phosphatase: 80 IU/L (ref 44–121)
BUN/Creatinine Ratio: 16 (ref 9–23)
BUN: 10 mg/dL (ref 6–24)
Bilirubin Total: <0.2 mg/dL (ref 0.0–1.2)
CO2: 22 mmol/L (ref 20–29)
Calcium: 9.3 mg/dL (ref 8.7–10.2)
Chloride: 103 mmol/L (ref 96–106)
Creatinine, Ser: 0.62 mg/dL (ref 0.57–1.00)
Globulin, Total: 3.3 g/dL (ref 1.5–4.5)
Glucose: 70 mg/dL (ref 70–99)
Potassium: 4.6 mmol/L (ref 3.5–5.2)
Sodium: 138 mmol/L (ref 134–144)
Total Protein: 7.5 g/dL (ref 6.0–8.5)
eGFR: 106 mL/min/{1.73_m2} (ref >59)

## 2023-09-14 LAB — LIPID PANEL
Chol/HDL Ratio: 3.5 ratio (ref 0.0–4.4)
Cholesterol, Total: 197 mg/dL (ref 100–199)
HDL: 56 mg/dL (ref >39)
LDL Chol Calc (NIH): 130 mg/dL — ABNORMAL HIGH (ref 0–99)
Triglycerides: 60 mg/dL (ref 0–149)
VLDL Cholesterol Cal: 11 mg/dL (ref 5–40)

## 2023-09-14 LAB — TSH: TSH: 1.56 u[IU]/mL (ref 0.450–4.500)

## 2023-09-14 LAB — HEMOGLOBIN A1C
Est. average glucose Bld gHb Est-mCnc: 108 mg/dL
Hgb A1c MFr Bld: 5.4 % (ref 4.8–5.6)

## 2023-09-14 LAB — VITAMIN D 25 HYDROXY (VIT D DEFICIENCY, FRACTURES): Vit D, 25-Hydroxy: 29.8 ng/mL — ABNORMAL LOW (ref 30.0–100.0)

## 2023-09-16 ENCOUNTER — Ambulatory Visit: Payer: Self-pay | Admitting: Family

## 2023-09-26 MED ORDER — VITAMIN D (ERGOCALCIFEROL) 1.25 MG (50000 UNIT) PO CAPS: 50000.0000 [IU] | ORAL_CAPSULE | ORAL | 0 refills | Status: AC

## 2023-10-11 ENCOUNTER — Ambulatory Visit: Admitting: Family Medicine

## 2023-12-09 ENCOUNTER — Ambulatory Visit
Admission: RE | Admit: 2023-12-09 | Discharge: 2023-12-09 | Disposition: A | Discharge status: Home / Self Care | Source: Ambulatory Visit | Attending: Family Medicine | Admitting: Family Medicine

## 2023-12-09 VITALS — BP 112/77 | HR 61 | Temp 97.9°F | Resp 20

## 2023-12-09 DIAGNOSIS — M545 Low back pain, unspecified: Secondary | ICD-10-CM | POA: Insufficient documentation

## 2023-12-09 DIAGNOSIS — R109 Unspecified abdominal pain: Secondary | ICD-10-CM | POA: Diagnosis not present

## 2023-12-09 LAB — POCT URINE DIPSTICK
Bilirubin, UA: NEGATIVE
Blood, UA: NEGATIVE
Glucose, UA: NEGATIVE mg/dL
Ketones, POC UA: NEGATIVE mg/dL
Leukocytes, UA: NEGATIVE
Nitrite, UA: NEGATIVE
Protein Ur, POC: NEGATIVE mg/dL
Spec Grav, UA: 1.02 (ref 1.010–1.025)
Urobilinogen, UA: 0.2 U/dL
pH, UA: 7 (ref 5.0–8.0)

## 2023-12-09 MED ORDER — MELOXICAM 7.5 MG PO TABS: 7.5000 mg | ORAL_TABLET | Freq: Every day | ORAL | 0 refills | Status: AC

## 2023-12-09 MED ORDER — CYCLOBENZAPRINE HCL 5 MG PO TABS: 5.0000 mg | ORAL_TABLET | Freq: Every evening | ORAL | 0 refills | Status: AC | PRN

## 2023-12-09 NOTE — ED Provider Notes (Signed)
 [facility]  Note:  This document was prepared using Conservation officer, historic buildings and may include unintentional dictation errors.  MRN: [id number] DOB: 16-Oct-1970  Subjective:   Sharon Meyer is a 53 y.o. female presenting for 1.5-week history of persistent moderate to severe right lower back pain, right lower flank pain.  No fall, trauma, weakness, numbness or tingling, changes to bowel or urinary habits.  Does not do a lot of heavy lifting.  However, she does work about 60 hours/week and has for quite some time.  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  If she has to bend, she bends at the waist as opposed to using her legs.  Has not used medications for relief if she prefers to avoid them.  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  No current facility-administered medications for this encounter.  Current Outpatient Medications:    cetirizine  (ZYRTEC  ALLERGY) 10 MG tablet, Take 1 tablet (10 mg total) by mouth daily., Disp: 30 tablet, Rfl: 0   fluticasone  (FLONASE ) 50 MCG/ACT nasal spray, Place 2 sprays into both nostrils daily. (Patient not taking: Reported on 09/13/2023), Disp: 16 g, Rfl: 0   meclizine  (ANTIVERT ) 12.5 MG tablet, Take 1 tablet (12.5 mg total) by mouth 3 (three) times daily as needed for dizziness., Disp: 30 tablet, Rfl: 0   sertraline  (ZOLOFT ) 50 MG tablet, Take 1 tablet (50 mg total) by mouth daily. (Patient not taking: Reported on 09/13/2023), Disp: 30 tablet, Rfl: 1   Vitamin D , Ergocalciferol , (DRISDOL ) 1.25 MG (50000 UNIT) CAPS capsule, Take 1 capsule (50,000 Units total) by mouth every 7 (seven) days., Disp: 12 capsule, Rfl: 0   No Known Allergies  Past Medical History:  Diagnosis Date   IDA (iron  deficiency anemia)    Intraductal papilloma of right breast    s/p right breast  lumpectomy 08/09/14, 05/21/18   PONV (postoperative nausea and vomiting)      Past Surgical History:  Procedure Laterality Date   ABDOMINAL HYSTERECTOMY N/A 03/24/2020   Procedure: ABDOMINAL HYSTERECTOMY WITH BILATERAL SALPINGECTOMY;  Surgeon: Bettina Muskrat, MD;  Location: [facility];  Service: Gynecology;  Laterality: N/A;   BREAST EXCISIONAL BIOPSY Right 08/09/2014   BREAST LUMPECTOMY Right 05/21/2018   Procedure: RIGHT BREAST LUMPECTOMY;  Surgeon: Mikell Katz, MD;  Location: [facility];  Service: General;  Laterality: Right;   BREAST LUMPECTOMY WITH RADIOACTIVE SEED LOCALIZATION Right 08/09/2014   Procedure: RIGHT BREAST LUMPECTOMY WITH RADIOACTIVE SEED LOCALIZATION;  Surgeon: Katz Mikell, MD;  Location: [facility];  Service: General;  Laterality: Right;   BREAST SURGERY N/A    Phreesia 11/10/2019   btl     CYSTOSCOPY  03/24/2020   Procedure: CYSTOSCOPY;  Surgeon: Bettina Muskrat, MD;  Location: [facility];  Service: Gynecology;;   HERNIA REPAIR     umbilical hernia    Family History  Problem Relation Age of Onset   Healthy Mother    Healthy Father     Social History   Tobacco Use   Smoking status: Never   Smokeless tobacco: Never  Vaping Use   Vaping status: Never Used  Substance Use Topics   Alcohol use: No   Drug use: No    ROS   Objective:   Vitals: BP 112/77 (BP Location: Left Arm)   Pulse 61   Temp  97.9 F (36.6 C) (Oral)   Resp 20   LMP 11/13/2019 (Approximate)   SpO2 97%   Physical Exam Constitutional:      General: She is not in acute distress.    Appearance: Normal appearance. She is well-developed. She is not ill-appearing, toxic-appearing or diaphoretic.  HENT:     Head: Normocephalic and atraumatic.     Nose: Nose normal.     Mouth/Throat:     Mouth: Mucous membranes are moist.  Eyes:     General: No scleral icterus.       Right eye: No discharge.        Left eye: No discharge.     Extraocular Movements: Extraocular  movements intact.  Cardiovascular:     Rate and Rhythm: Normal rate.  Pulmonary:     Effort: Pulmonary effort is normal.  Musculoskeletal:     Lumbar back: Spasms and tenderness present. No swelling, edema, deformity, signs of trauma, lacerations or bony tenderness. Normal range of motion. Negative right straight leg raise test and negative left straight leg raise test. No scoliosis.       Back:  Skin:    General: Skin is warm and dry.  Neurological:     General: No focal deficit present.     Mental Status: She is alert and oriented to person, place, and time.  Psychiatric:        Mood and Affect: Mood normal.        Behavior: Behavior normal.     Results for orders placed or performed during the hospital encounter of 12/09/23 (from the past 24 hours)  POCT URINE DIPSTICK     Status: Normal   Collection Time: 12/09/23  9:34 AM  Result Value Ref Range   Color, UA yellow yellow   Clarity, UA clear clear   Glucose, UA negative negative mg/dL   Bilirubin, UA negative negative   Ketones, POC UA negative negative mg/dL   Spec Grav, UA 8.979 8.989 - 1.025   Blood, UA negative negative   pH, UA 7.0 5.0 - 8.0   Protein Ur, POC negative negative mg/dL   Urobilinogen, UA 0.2 0.2 or 1.0 E.U./dL   Nitrite, UA Negative Negative   Leukocytes, UA Negative Negative    Assessment and Plan :   PDMP not reviewed this encounter.  1. Acute right-sided low back pain without sciatica   2. Right flank pain    Urine culture pending, continue to hydrate well consistently.  Discussed appropriate back care.  Will provide a note for her work to limit her from 60 hours to 40 hours in a given work week.  Use meloxicam  as needed for pain.  If this is not resolve her back pain, consider imaging and steroids.  Use cyclobenzaprine  as well.  Counseled patient on potential for adverse effects with medications prescribed/recommended today, ER and return-to-clinic precautions discussed, patient verbalized  understanding.    Christopher Savannah, [place] 12/09/23 [phone]

## 2023-12-09 NOTE — ED Triage Notes (Signed)
 Pt c/o right flank pain x 1.5 weeks-no pain meds PTA-NAD-steady gait

## 2023-12-11 LAB — URINE CULTURE: Culture: NO GROWTH

## 2023-12-12 ENCOUNTER — Ambulatory Visit (HOSPITAL_COMMUNITY): Payer: Self-pay

## 2024-01-22 DIAGNOSIS — N951 Menopausal and female climacteric states: Secondary | ICD-10-CM | POA: Diagnosis not present

## 2024-01-22 DIAGNOSIS — Z1231 Encounter for screening mammogram for malignant neoplasm of breast: Secondary | ICD-10-CM | POA: Diagnosis not present

## 2024-01-22 DIAGNOSIS — Z01419 Encounter for gynecological examination (general) (routine) without abnormal findings: Secondary | ICD-10-CM | POA: Diagnosis not present

## 2024-04-29 ENCOUNTER — Ambulatory Visit: Payer: Self-pay

## 2024-04-29 NOTE — Telephone Encounter (Signed)
 Noted thank you

## 2024-04-29 NOTE — Telephone Encounter (Signed)
 FYI Only or Action Required?: FYI only for provider: home care advice provided, plans to go to urgent care on 04/30/24.  Patient was last seen in primary care on 09/13/2023 by Tanda Bleacher, MD.  Called Nurse Triage reporting Epistaxis.  Symptoms began several weeks ago.  Interventions attempted: Nothing.  Symptoms are: completely resolved.  Triage Disposition: Home Care  Patient/caregiver understands and will follow disposition?: Yes  Reason for Disposition  [1] Mild-moderate nosebleed AND [2] bleeding stopped now  Answer Assessment - Initial Assessment Questions Requesting appointment with pcp or other provider tomorrow to evaluated recurring nosebleeds. No appointments are available, home care instructions provided. She plans to attend urgent care 04/30/24   1. AMOUNT OF BLEEDING: How bad is the bleeding? How much blood was lost? Has the bleeding stopped?     Small amount  2. ONSET: When did the nosebleed start?      Last nosebleed this morning. Pinched for 'few seconds' and that stopped the bleeding.  3. FREQUENCY: How many nosebleeds have you had in the last 24 hours?      1 4. RECURRENT SYMPTOMS: Have there been other recent nosebleeds? If Yes, ask: How long did it take you to stop the bleeding? What worked best?      Yes-intermittently chronic 5. CAUSE: What do you think caused this nosebleed?     Works with carbon and menthol  6. LOCAL FACTORS: Do you have any cold symptoms?, Have you been rubbing or picking at your nose?    Nasal congestion- dried blood noted when blowing nose 7. SYSTEMIC FACTORS: Do you have high blood pressure or any bleeding problems?     Denies  8. BLOOD THINNERS: Do you take any blood thinners? (e.g., aspirin , clopidogrel / Plavix, coumadin, heparin). Notes: Other strong blood thinners include: Arixtra (fondaparinux), Eliquis (apixaban), Pradaxa (dabigatran), and Xarelto (rivaroxaban).     Denies  9. OTHER SYMPTOMS: Do you  have any other symptoms? (e.g., lightheadedness)     Intermittent dizziness and unexplained bruises that she attributes to history of anemia  Protocols used: Nosebleed-A-AH  Copied from CRM [id number]. Topic: Clinical - Red Word Triage >> Apr 29, 2024 11:55 AM Alfonso ORN wrote: Red Word that prompted transfer to Nurse Triage: nose keeps clogging and bleeding ( not heavy bleeding), has been ongoing but restarted in past week or two

## 2024-06-11 ENCOUNTER — Ambulatory Visit: Payer: Self-pay | Admitting: Family Medicine

## 2024-09-18 ENCOUNTER — Encounter: Admitting: Family Medicine
